# Patient Record
Sex: Female | Born: 1960
Health system: Southern US, Community
[De-identification: ages and names within clinical notes are randomized; demographics above are authoritative.]

## PROBLEM LIST (undated history)

## (undated) ENCOUNTER — Inpatient Hospital Stay (AMBULATORY_SURGERY_CENTER): Payer: BLUE CROSS/BLUE SHIELD | Admitting: Podiatry

## (undated) DIAGNOSIS — R519 Headache, unspecified: Secondary | ICD-10-CM

## (undated) DIAGNOSIS — J302 Other seasonal allergic rhinitis: Secondary | ICD-10-CM

## (undated) DIAGNOSIS — E039 Hypothyroidism, unspecified: Secondary | ICD-10-CM

## (undated) HISTORY — DX: Hypothyroidism, unspecified: E03.9

## (undated) HISTORY — DX: Headache, unspecified: R51.9

## (undated) HISTORY — PX: OTHER SURGICAL HISTORY: SHX169

## (undated) HISTORY — PX: GANGLION CYST EXCISION: SHX1691

## (undated) HISTORY — PX: APPENDECTOMY: SHX54

## (undated) HISTORY — PX: LIPOMA EXCISION: SHX5283

## (undated) HISTORY — DX: Other seasonal allergic rhinitis: J30.2

---

## 2004-06-24 ENCOUNTER — Ambulatory Visit: Payer: Self-pay | Admitting: Internal Medicine

## 2004-07-25 ENCOUNTER — Ambulatory Visit: Payer: Self-pay | Admitting: Internal Medicine

## 2005-07-03 ENCOUNTER — Ambulatory Visit: Payer: Self-pay

## 2006-05-31 ENCOUNTER — Ambulatory Visit: Payer: Self-pay | Admitting: Family Medicine

## 2007-10-09 ENCOUNTER — Ambulatory Visit: Payer: Self-pay | Admitting: Internal Medicine

## 2008-10-15 ENCOUNTER — Ambulatory Visit: Payer: Self-pay | Admitting: Internal Medicine

## 2009-05-03 ENCOUNTER — Ambulatory Visit: Payer: Self-pay | Admitting: Internal Medicine

## 2010-10-25 ENCOUNTER — Ambulatory Visit: Payer: Self-pay | Admitting: Internal Medicine

## 2011-10-26 ENCOUNTER — Ambulatory Visit: Payer: Self-pay

## 2012-11-06 ENCOUNTER — Ambulatory Visit: Payer: Self-pay

## 2013-12-04 ENCOUNTER — Ambulatory Visit: Payer: Self-pay

## 2014-03-12 ENCOUNTER — Ambulatory Visit: Payer: Self-pay | Admitting: Gastroenterology

## 2014-03-12 LAB — HM COLONOSCOPY

## 2015-06-08 DIAGNOSIS — D1722 Benign lipomatous neoplasm of skin and subcutaneous tissue of left arm: Secondary | ICD-10-CM | POA: Diagnosis not present

## 2015-06-08 DIAGNOSIS — M720 Palmar fascial fibromatosis [Dupuytren]: Secondary | ICD-10-CM | POA: Diagnosis not present

## 2015-06-08 DIAGNOSIS — M67449 Ganglion, unspecified hand: Secondary | ICD-10-CM | POA: Diagnosis not present

## 2015-06-18 DIAGNOSIS — M720 Palmar fascial fibromatosis [Dupuytren]: Secondary | ICD-10-CM | POA: Diagnosis not present

## 2015-06-18 DIAGNOSIS — D1722 Benign lipomatous neoplasm of skin and subcutaneous tissue of left arm: Secondary | ICD-10-CM | POA: Diagnosis not present

## 2015-09-23 ENCOUNTER — Encounter: Payer: Self-pay | Admitting: Podiatry

## 2015-09-23 ENCOUNTER — Ambulatory Visit (INDEPENDENT_AMBULATORY_CARE_PROVIDER_SITE_OTHER): Payer: BLUE CROSS/BLUE SHIELD | Admitting: Podiatry

## 2015-09-23 ENCOUNTER — Ambulatory Visit (INDEPENDENT_AMBULATORY_CARE_PROVIDER_SITE_OTHER): Payer: BLUE CROSS/BLUE SHIELD

## 2015-09-23 VITALS — BP 123/79 | HR 80 | Resp 16

## 2015-09-23 DIAGNOSIS — M722 Plantar fascial fibromatosis: Secondary | ICD-10-CM

## 2015-09-23 NOTE — Progress Notes (Signed)
   Subjective:    Patient ID: Megan [name], female    DOB: Dec 02, 1960, 55 y.o.   MRN: [id number]  HPI: She presents today with a chief complaint of painful nodules to the plantar aspect of the bilateral foot for the past couple of years she states the left one has most recently become more painful and larger in size. She does relate a history of Dupuytren contracture to her left hand. She denies any trauma to her feet.    Review of Systems  HENT: Positive for sinus pressure.   Musculoskeletal: Positive for arthralgias.  All other systems reviewed and are negative.      Objective:   Physical Exam: Vital signs are stable she is alert and oriented 3. Pulses are palpable. Neurologic sensorium is intact. Deep tendon reflexes are intact. Muscle strength is normal symmetrical bilateral. Orthopedic evaluation was resolved with distal ankle full range of motion without crepitus she has solitary lesions to the medial band of the plantar fascia bilaterally the right appears to be much smaller than that of the left measuring 0.8 cm in total diameter and is fairly flat on the right side versus elevated nonpulsatile mass measuring greater than 1 cm in diameter on the left medial band of the plantar fascia. Radiographs do not demonstrate any type of calcification ossification of these lesions.        Assessment & Plan:  Plantar fibromatosis bilateral.  Plan: Discussed etiology pathology conservative versus surgical therapies. I injected these areas today with Kenalog and local anesthetic and I will follow-up with her in 3 months.

## 2015-10-11 ENCOUNTER — Telehealth: Payer: Self-pay

## 2015-10-11 NOTE — Telephone Encounter (Signed)
Records on this pt placed in your folder for review. Pt has appt Friday 10/15/2015 [facility]

## 2015-10-15 ENCOUNTER — Encounter: Payer: BLUE CROSS/BLUE SHIELD | Admitting: Family Medicine

## 2015-11-03 ENCOUNTER — Encounter: Payer: BLUE CROSS/BLUE SHIELD | Admitting: Family Medicine

## 2015-11-05 ENCOUNTER — Ambulatory Visit (INDEPENDENT_AMBULATORY_CARE_PROVIDER_SITE_OTHER): Payer: BLUE CROSS/BLUE SHIELD | Admitting: Family Medicine

## 2015-11-05 ENCOUNTER — Encounter: Payer: Self-pay | Admitting: Family Medicine

## 2015-11-05 VITALS — BP 122/80 | HR 60 | Ht 68.25 in | Wt 225.0 lb

## 2015-11-05 DIAGNOSIS — Z0000 Encounter for general adult medical examination without abnormal findings: Secondary | ICD-10-CM | POA: Diagnosis not present

## 2015-11-05 DIAGNOSIS — Z23 Encounter for immunization: Secondary | ICD-10-CM | POA: Diagnosis not present

## 2015-11-05 DIAGNOSIS — E669 Obesity, unspecified: Secondary | ICD-10-CM | POA: Diagnosis not present

## 2015-11-05 DIAGNOSIS — Z1231 Encounter for screening mammogram for malignant neoplasm of breast: Secondary | ICD-10-CM | POA: Diagnosis not present

## 2015-11-05 DIAGNOSIS — Z1159 Encounter for screening for other viral diseases: Secondary | ICD-10-CM

## 2015-11-05 DIAGNOSIS — E039 Hypothyroidism, unspecified: Secondary | ICD-10-CM | POA: Diagnosis not present

## 2015-11-05 DIAGNOSIS — Z1239 Encounter for other screening for malignant neoplasm of breast: Secondary | ICD-10-CM

## 2015-11-05 LAB — POCT URINALYSIS DIPSTICK
Bilirubin, UA: NEGATIVE
Glucose, UA: NEGATIVE
KETONES UA: NEGATIVE
Leukocytes, UA: NEGATIVE
Nitrite, UA: NEGATIVE
PH UA: 7
PROTEIN UA: NEGATIVE
RBC UA: NEGATIVE
SPEC GRAV UA: 1.015
UROBILINOGEN UA: NEGATIVE

## 2015-11-05 LAB — COMPREHENSIVE METABOLIC PANEL
ALBUMIN: 4.3 g/dL (ref 3.6–5.1)
ALT: 15 U/L (ref 6–29)
AST: 19 U/L (ref 10–35)
Alkaline Phosphatase: 69 U/L (ref 33–130)
BUN: 13 mg/dL (ref 7–25)
CHLORIDE: 107 mmol/L (ref 98–110)
CO2: 23 mmol/L (ref 20–31)
CREATININE: 0.92 mg/dL (ref 0.50–1.05)
Calcium: 9.5 mg/dL (ref 8.6–10.4)
Glucose, Bld: 98 mg/dL (ref 65–99)
Potassium: 4.9 mmol/L (ref 3.5–5.3)
SODIUM: 141 mmol/L (ref 135–146)
Total Bilirubin: 0.5 mg/dL (ref 0.2–1.2)
Total Protein: 7 g/dL (ref 6.1–8.1)

## 2015-11-05 LAB — CBC WITH DIFFERENTIAL/PLATELET
BASOS ABS: 47 {cells}/uL (ref 0–200)
Basophils Relative: 1 %
EOS PCT: 2 %
Eosinophils Absolute: 94 cells/uL (ref 15–500)
HCT: 43.6 % (ref 35.0–45.0)
HEMOGLOBIN: 14.5 g/dL (ref 11.7–15.5)
LYMPHS ABS: 1833 {cells}/uL (ref 850–3900)
LYMPHS PCT: 39 %
MCH: 31 pg (ref 27.0–33.0)
MCHC: 33.3 g/dL (ref 32.0–36.0)
MCV: 93.4 fL (ref 80.0–100.0)
MONOS PCT: 11 %
MPV: 9.9 fL (ref 7.5–12.5)
Monocytes Absolute: 517 cells/uL (ref 200–950)
NEUTROS PCT: 47 %
Neutro Abs: 2209 cells/uL (ref 1500–7800)
Platelets: 269 10*3/uL (ref 140–400)
RBC: 4.67 MIL/uL (ref 3.80–5.10)
RDW: 13.2 % (ref 11.0–15.0)
WBC: 4.7 10*3/uL (ref 4.0–10.5)

## 2015-11-05 LAB — LIPID PANEL
CHOL/HDL RATIO: 2.9 ratio (ref <=5.0)
CHOLESTEROL: 171 mg/dL (ref 125–200)
HDL: 59 mg/dL (ref >=46)
LDL Cholesterol: 94 mg/dL (ref <130)
Triglycerides: 92 mg/dL (ref <150)
VLDL: 18 mg/dL (ref <30)

## 2015-11-05 LAB — TSH: TSH: 0.05 m[IU]/L — AB

## 2015-11-05 NOTE — Telephone Encounter (Signed)
This encounter was created in error - please disregard.

## 2015-11-05 NOTE — Progress Notes (Signed)
Subjective:    Patient ID: Megan [name], female    DOB: May 26, 1960, 55 y.o.   MRN: [id number]  HPI Chief Complaint  Patient presents with  . new pt    new pt, fasting cpe. no concerns.    She is new to the practice and here for a complete physical exam. Previous medical care: [facility] in [place].  [place]- had a lipoma removed from her upper arm.   Last CPE: a year ago.   Other providers: [facility]- lipoma on arm. Podiatrist - [place] for ganglion and lederhousen   Past medical history: hypothyroidism. Diagnosed 21 years ago after son was born.   Social history: Lives with husband, works at a [employer],  Diet: nothing particular Excerise: zumba, strength training   Immunizations: Tdap unknown. Flu shot   Health maintenance:  Mammogram: October 2016 at [facility] [place]  Colonoscopy: 2016- [place]. Normal. 10 year.  Last Gynecological Exam: 2 years ago. Normal pap smear Last Menstrual cycle: 3 years ago- ablation Pregnancies: 3 Last Dental Exam: twice annually  Last Eye Exam: annually   Wears seatbelt always, uses sunscreen, smoke detectors in home and functioning, does not text while driving and feels safe in home environment.   Reviewed allergies, medications, past medical, surgical, family, and social history.   Review of Systems Review of Systems Constitutional: -fever, -chills, -sweats, -unexpected weight change,-fatigue ENT: -runny nose, -ear pain, -sore throat Cardiology:  -chest pain, -palpitations, -edema Respiratory: -cough, -shortness of breath, -wheezing Gastroenterology: -abdominal pain, -nausea, -vomiting, -diarrhea, -constipation  Hematology: -bleeding or bruising problems Musculoskeletal: -arthralgias, -myalgias, -joint swelling, -back pain Ophthalmology: -vision changes Urology: -dysuria, -difficulty urinating, -hematuria, -urinary frequency, -urgency Neurology: -headache, -weakness, -tingling,  -numbness       Objective:   Physical Exam BP 122/80   Pulse 60   Ht 5' 8.25" (1.734 m)   Wt 225 lb (102.1 kg)   BMI 33.96 kg/m   General Appearance:    Alert, cooperative, no distress, appears stated age  Head:    Normocephalic, without obvious abnormality, atraumatic  Eyes:    PERRL, conjunctiva/corneas clear, EOM's intact, fundi    benign  Ears:    Normal TM's and external ear canals  Nose:   Nares normal, mucosa normal, no drainage or sinus   tenderness  Throat:   Lips, mucosa, and tongue normal; teeth and gums normal  Neck:   Supple, no lymphadenopathy;  thyroid:  no   enlargement/tenderness/nodules; no carotid   bruit or JVD  Back:    Spine nontender, no curvature, ROM normal, no CVA     tenderness  Lungs:     Clear to auscultation bilaterally without wheezes, rales or     ronchi; respirations unlabored  Chest Wall:    No tenderness or deformity   Heart:    Regular rate and rhythm, S1 and S2 normal, no murmur, rub   or gallop  Breast Exam:    No tenderness, masses, or nipple discharge or inversion.      No axillary lymphadenopathy  Abdomen:     Soft, non-tender, nondistended, normoactive bowel sounds,    no masses, no hepatosplenomegaly  Genitalia:    Declined. Up to date.   Rectal:    Normal tone, no masses or tenderness; guaiac negative stool  Extremities:   No clubbing, cyanosis or edema  Pulses:   2+ and symmetric all extremities  Skin:   Skin color, texture, turgor normal, no rashes or lesions  Lymph nodes:   Cervical, supraclavicular, and axillary nodes normal  Neurologic:   CNII-XII intact, normal strength, sensation and gait; reflexes 2+ and symmetric throughout          Psych:   Normal mood, affect, hygiene and grooming.    Urinalysis dipstick: negative     Assessment & Plan:  Routine general medical examination at a health care facility - Plan: Urinalysis Dipstick, CBC with Differential/Platelet, Comprehensive metabolic panel, TSH, Lipid panel, VITAMIN D 25  Hydroxy (Vit-D Deficiency, Fractures)  Hypothyroidism, unspecified type - Plan: TSH  Need for hepatitis C screening test - Plan: Hepatitis C antibody  Obesity (BMI 30-39.9) - Plan: TSH, Lipid panel, VITAMIN D 25 Hydroxy (Vit-D Deficiency, Fractures)  Need for prophylactic vaccination and inoculation against influenza - Plan: Flu Vaccine QUAD 36+ mos IM  Need for Tdap vaccination - Plan: Tdap vaccine greater than or equal to 7yo IM  Screening for breast cancer - Plan: MM DIGITAL SCREENING BILATERAL  Discussed that she appears to be doing well overall and I recommend that she start eating a healthy well balanced diet. Recommend to try My Fitness Pal to track daily calories and nutrition intake. She is doing a good job exercising and encouraged her to keep this up. She is aware her BMI places her in the obese category.  She has a good demeanor and overall outlook.  Discussed safety and health promotion.  One time hep C test ordered per guidelines for her age group. Mammogram ordered.  Tdap and Flu shot given.  Will check labs and follow up pending.  She has been stable on thyroid medication for several years and will continue to monitor and refill medication as needed.

## 2015-11-05 NOTE — Patient Instructions (Signed)
You received your flu shot and Tdap today.  You can try a free app called My Fitness Pal to track your daily calories and nutrition.  Continue exercising.  Call and schedule your mammogram for March or April if you are not having any problems before then. The order is in the system.  We will call you with lab results.   Preventive Care for Adults - Female      MAINTAIN REGULAR HEALTH EXAMS:  A routine yearly physical is a good way to check in with your primary care provider about your health and preventive screening. It is also an opportunity to share updates about your health and any concerns you have, and receive a thorough all-over exam.   Most health insurance companies pay for at least some preventative services.  Check with your health plan for specific coverages.  WHAT PREVENTATIVE SERVICES DO WOMEN NEED?  Adult women should have their weight and blood pressure checked regularly.   Women age 2 and older should have their cholesterol levels checked regularly.  Women should be screened for cervical cancer with a Pap smear and pelvic exam beginning at either age 8, or 3 years after they become sexually activity.    Breast cancer screening generally begins at age 19 with a mammogram and breast exam by your primary care provider.    Beginning at age 34 and continuing to age 43, women should be screened for colorectal cancer.  Certain people may need continued testing until age 27.  Updating vaccinations is part of preventative care.  Vaccinations help protect against diseases such as the flu.  Osteoporosis is a disease in which the bones lose minerals and strength as we age. Women ages 29 and over should discuss this with their caregivers, as should women after menopause who have other risk factors.  Lab tests are generally done as part of preventative care to screen for anemia and blood disorders, to screen for problems with the kidneys and liver, to screen for bladder problems, to  check blood sugar, and to check your cholesterol level.  Preventative services generally include counseling about diet, exercise, avoiding tobacco, drugs, excessive alcohol consumption, and sexually transmitted infections.    GENERAL RECOMMENDATIONS FOR GOOD HEALTH:  Healthy diet:  Eat a variety of foods, including fruit, vegetables, animal or vegetable protein, such as meat, fish, chicken, and eggs, or beans, lentils, tofu, and grains, such as rice.  Drink plenty of water daily.  Decrease saturated fat in the diet, avoid lots of red meat, processed foods, sweets, fast foods, and fried foods.  Exercise:  Aerobic exercise helps maintain good heart health. At least 30-40 minutes of moderate-intensity exercise is recommended. For example, a brisk walk that increases your heart rate and breathing. This should be done on most days of the week.   Find a type of exercise or a variety of exercises that you enjoy so that it becomes a part of your daily life.  Examples are running, walking, swimming, water aerobics, and biking.  For motivation and support, explore group exercise such as aerobic class, spin class, Zumba, Yoga,or  martial arts, etc.    Set exercise goals for yourself, such as a certain weight goal, walk or run in a race such as a 5k walk/run.  Speak to your primary care provider about exercise goals.  Disease prevention:  If you smoke or chew tobacco, find out from your caregiver how to quit. It can literally save your life, no matter how long  you have been a tobacco user. If you do not use tobacco, never begin.   Maintain a healthy diet and normal weight. Increased weight leads to problems with blood pressure and diabetes.   The Body Mass Index or BMI is a way of measuring how much of your body is fat. Having a BMI above 27 increases the risk of heart disease, diabetes, hypertension, stroke and other problems related to obesity. Your caregiver can help determine your BMI and based  on it develop an exercise and dietary program to help you achieve or maintain this important measurement at a healthful level.  High blood pressure causes heart and blood vessel problems.  Persistent high blood pressure should be treated with medicine if weight loss and exercise do not work.   Fat and cholesterol leaves deposits in your arteries that can block them. This causes heart disease and vessel disease elsewhere in your body.  If your cholesterol is found to be high, or if you have heart disease or certain other medical conditions, then you may need to have your cholesterol monitored frequently and be treated with medication.   Ask if you should have a cardiac stress test if your history suggests this. A stress test is a test done on a treadmill that looks for heart disease. This test can find disease prior to there being a problem.  Menopause can be associated with physical symptoms and risks. Hormone replacement therapy is available to decrease these. You should talk to your caregiver about whether starting or continuing to take hormones is right for you.   Osteoporosis is a disease in which the bones lose minerals and strength as we age. This can result in serious bone fractures. Risk of osteoporosis can be identified using a bone density scan. Women ages 65 and over should discuss this with their caregivers, as should women after menopause who have other risk factors. Ask your caregiver whether you should be taking a calcium supplement and Vitamin D, to reduce the rate of osteoporosis.   Avoid drinking alcohol in excess (more than two drinks per day).  Avoid use of street drugs. Do not share needles with anyone. Ask for professional help if you need assistance or instructions on stopping the use of alcohol, cigarettes, and/or drugs.  Brush your teeth twice a day with fluoride toothpaste, and floss once a day. Good oral hygiene prevents tooth decay and gum disease. The problems can be  painful, unattractive, and can cause other health problems. Visit your dentist for a routine oral and dental check up and preventive care every 6-12 months.   Look at your skin regularly.  Use a mirror to look at your back. Notify your caregivers of changes in moles, especially if there are changes in shapes, colors, a size larger than a pencil eraser, an irregular border, or development of new moles.  Safety:  Use seatbelts 100% of the time, whether driving or as a passenger.  Use safety devices such as hearing protection if you work in environments with loud noise or significant background noise.  Use safety glasses when doing any work that could send debris in to the eyes.  Use a helmet if you ride a bike or motorcycle.  Use appropriate safety gear for contact sports.  Talk to your caregiver about gun safety.  Use sunscreen with a SPF (or skin protection factor) of 15 or greater.  Lighter skinned people are at a greater risk of skin cancer. Don't forget to also wear sunglasses  in order to protect your eyes from too much damaging sunlight. Damaging sunlight can accelerate cataract formation.   Practice safe sex. Use condoms. Condoms are used for birth control and to help reduce the spread of sexually transmitted infections (or STIs).  Some of the STIs are gonorrhea (the clap), chlamydia, syphilis, trichomonas, herpes, HPV (human papilloma virus) and HIV (human immunodeficiency virus) which causes AIDS. The herpes, HIV and HPV are viral illnesses that have no cure. These can result in disability, cancer and death.   Keep carbon monoxide and smoke detectors in your home functioning at all times. Change the batteries every 6 months or use a model that plugs into the wall.   Vaccinations:  Stay up to date with your tetanus shots and other required immunizations. You should have a booster for tetanus every 10 years. Be sure to get your flu shot every year, since 5%-20% of the [place] population comes down  with the flu. The flu vaccine changes each year, so being vaccinated once is not enough. Get your shot in the fall, before the flu season peaks.   Other vaccines to consider:  Human Papilloma Virus or HPV causes cancer of the cervix, and other infections that can be transmitted from person to person. There is a vaccine for HPV, and females should get immunized between the ages of 74 and 16. It requires a series of 3 shots.   Pneumococcal vaccine to protect against certain types of pneumonia.  This is normally recommended for adults age 62 or older.  However, adults younger than 53 years old with certain underlying conditions such as diabetes, heart or lung disease should also receive the vaccine.  Shingles vaccine to protect against Varicella Zoster if you are older than age 75, or younger than 53 years old with certain underlying illness.  Hepatitis A vaccine to protect against a form of infection of the liver by a virus acquired from food.  Hepatitis B vaccine to protect against a form of infection of the liver by a virus acquired from blood or body fluids, particularly if you work in health care.  If you plan to travel internationally, check with your local health department for specific vaccination recommendations.  Cancer Screening:  Breast cancer screening is essential to preventive care for women. All women age 68 and older should perform a breast self-exam every month. At age 36 and older, women should have their caregiver complete a breast exam each year. Women at ages 80 and older should have a mammogram (x-ray film) of the breasts. Your caregiver can discuss how often you need mammograms.    Cervical cancer screening includes taking a Pap smear (sample of cells examined under a microscope) from the cervix (end of the uterus). It also includes testing for HPV (Human Papilloma Virus, which can cause cervical cancer). Screening and a pelvic exam should begin at age 61, or 3 years after a  woman becomes sexually active. Screening should occur every year, with a Pap smear but no HPV testing, up to age 7. After age 46, you should have a Pap smear every 3 years with HPV testing, if no HPV was found previously.   Most routine colon cancer screening begins at the age of 70. On a yearly basis, doctors may provide special easy to use take-home tests to check for hidden blood in the stool. Sigmoidoscopy or colonoscopy can detect the earliest forms of colon cancer and is life saving. These tests use a small camera at the  end of a tube to directly examine the colon. Speak to your caregiver about this at age 34, when routine screening begins (and is repeated every 5 years unless early forms of pre-cancerous polyps or small growths are found).

## 2015-11-06 LAB — VITAMIN D 25 HYDROXY (VIT D DEFICIENCY, FRACTURES): VIT D 25 HYDROXY: 30 ng/mL (ref 30–100)

## 2015-11-06 LAB — HEPATITIS C ANTIBODY: HCV Ab: NEGATIVE

## 2015-11-08 ENCOUNTER — Telehealth: Payer: Self-pay

## 2015-11-08 ENCOUNTER — Other Ambulatory Visit: Payer: Self-pay | Admitting: Family Medicine

## 2015-11-08 DIAGNOSIS — E039 Hypothyroidism, unspecified: Secondary | ICD-10-CM

## 2015-11-08 MED ORDER — LEVOTHYROXINE SODIUM 125 MCG PO TABS: 125.0000 ug | ORAL_TABLET | Freq: Every day | ORAL | 3 refills | Status: DC

## 2015-11-08 NOTE — Telephone Encounter (Signed)
Already spoke with patient about lab results

## 2015-11-08 NOTE — Telephone Encounter (Signed)
Pt returning your call [phone]. Megan [name]

## 2015-11-17 ENCOUNTER — Encounter: Payer: Self-pay | Admitting: Family Medicine

## 2015-12-23 ENCOUNTER — Ambulatory Visit (INDEPENDENT_AMBULATORY_CARE_PROVIDER_SITE_OTHER): Payer: BLUE CROSS/BLUE SHIELD | Admitting: Podiatry

## 2015-12-23 ENCOUNTER — Encounter: Payer: Self-pay | Admitting: Podiatry

## 2015-12-23 DIAGNOSIS — M722 Plantar fascial fibromatosis: Secondary | ICD-10-CM | POA: Diagnosis not present

## 2015-12-24 DIAGNOSIS — D2372 Other benign neoplasm of skin of left lower limb, including hip: Secondary | ICD-10-CM | POA: Diagnosis not present

## 2015-12-24 DIAGNOSIS — L821 Other seborrheic keratosis: Secondary | ICD-10-CM | POA: Diagnosis not present

## 2015-12-24 DIAGNOSIS — L814 Other melanin hyperpigmentation: Secondary | ICD-10-CM | POA: Diagnosis not present

## 2015-12-24 DIAGNOSIS — D2371 Other benign neoplasm of skin of right lower limb, including hip: Secondary | ICD-10-CM | POA: Diagnosis not present

## 2015-12-24 NOTE — Progress Notes (Signed)
She presents today for follow-up of her plantar fibromas bilaterally. She states the left foot is doing very well the right foot still has some soreness in it and she points to the plantar fibromas distal medial plantar fascial band.  Objective: Plantar fibromas have decreased in size considerably. No erythema edema cellulitis drainage or odor.  Assessment: Plantar fasciitis plantar fibromatosis right greater than left.  Plan: I injected the right plantar fibroma today with Kenalog and local anesthetic. I will follow-up with her in 3 months.

## 2015-12-30 DIAGNOSIS — M67449 Ganglion, unspecified hand: Secondary | ICD-10-CM | POA: Diagnosis not present

## 2016-01-10 ENCOUNTER — Other Ambulatory Visit: Payer: BLUE CROSS/BLUE SHIELD

## 2016-01-12 ENCOUNTER — Other Ambulatory Visit: Payer: Self-pay

## 2016-01-12 DIAGNOSIS — M67442 Ganglion, left hand: Secondary | ICD-10-CM | POA: Diagnosis not present

## 2016-01-12 DIAGNOSIS — M67441 Ganglion, right hand: Secondary | ICD-10-CM | POA: Diagnosis not present

## 2016-01-12 DIAGNOSIS — D2112 Benign neoplasm of connective and other soft tissue of left upper limb, including shoulder: Secondary | ICD-10-CM | POA: Diagnosis not present

## 2016-01-12 DIAGNOSIS — D2111 Benign neoplasm of connective and other soft tissue of right upper limb, including shoulder: Secondary | ICD-10-CM | POA: Diagnosis not present

## 2016-02-01 ENCOUNTER — Other Ambulatory Visit: Payer: BLUE CROSS/BLUE SHIELD

## 2016-02-01 DIAGNOSIS — E039 Hypothyroidism, unspecified: Secondary | ICD-10-CM

## 2016-02-01 LAB — T4, FREE: Free T4: 1.2 ng/dL (ref 0.8–1.8)

## 2016-02-01 LAB — TSH: TSH: 3.31 mIU/L

## 2016-02-02 LAB — T3: T3 TOTAL: 105 ng/dL (ref 76–181)

## 2016-02-07 ENCOUNTER — Other Ambulatory Visit: Payer: Self-pay | Admitting: Family Medicine

## 2016-02-26 DIAGNOSIS — J069 Acute upper respiratory infection, unspecified: Secondary | ICD-10-CM | POA: Diagnosis not present

## 2016-03-23 ENCOUNTER — Ambulatory Visit: Payer: BLUE CROSS/BLUE SHIELD | Admitting: Podiatry

## 2016-05-05 ENCOUNTER — Other Ambulatory Visit: Payer: Self-pay | Admitting: Family Medicine

## 2016-05-05 DIAGNOSIS — Z1231 Encounter for screening mammogram for malignant neoplasm of breast: Secondary | ICD-10-CM

## 2016-05-24 ENCOUNTER — Ambulatory Visit
Admission: RE | Admit: 2016-05-24 | Discharge: 2016-05-24 | Disposition: A | Discharge status: Home / Self Care | Payer: BLUE CROSS/BLUE SHIELD | Source: Ambulatory Visit | Attending: Family Medicine | Admitting: Family Medicine

## 2016-05-24 DIAGNOSIS — Z1231 Encounter for screening mammogram for malignant neoplasm of breast: Secondary | ICD-10-CM | POA: Diagnosis not present

## 2016-06-09 ENCOUNTER — Other Ambulatory Visit: Payer: Self-pay | Admitting: Family Medicine

## 2016-09-16 ENCOUNTER — Other Ambulatory Visit: Payer: Self-pay | Admitting: Family Medicine

## 2016-10-27 ENCOUNTER — Ambulatory Visit (INDEPENDENT_AMBULATORY_CARE_PROVIDER_SITE_OTHER): Payer: BLUE CROSS/BLUE SHIELD | Admitting: Medical

## 2016-10-27 ENCOUNTER — Encounter: Payer: Self-pay | Admitting: Medical

## 2016-10-27 VITALS — BP 132/80 | HR 62 | Temp 98.1°F | Wt 212.2 lb

## 2016-10-27 DIAGNOSIS — R300 Dysuria: Secondary | ICD-10-CM | POA: Diagnosis not present

## 2016-10-27 LAB — POCT URINALYSIS DIP (PROADVANTAGE DEVICE)
BILIRUBIN UA: NEGATIVE
Blood, UA: NEGATIVE
GLUCOSE UA: NEGATIVE mg/dL
Ketones, POC UA: NEGATIVE mg/dL
LEUKOCYTES UA: NEGATIVE
NITRITE UA: NEGATIVE
Protein Ur, POC: NEGATIVE mg/dL
Specific Gravity, Urine: 1.015
Urobilinogen, Ur: NEGATIVE
pH, UA: 6 (ref 5.0–8.0)

## 2016-10-27 MED ORDER — SULFAMETHOXAZOLE-TRIMETHOPRIM 800-160 MG PO TABS: 1.0000 | ORAL_TABLET | Freq: Two times a day (BID) | ORAL | 0 refills | Status: DC

## 2016-10-27 NOTE — Progress Notes (Signed)
Subjective:  Megan [name] is a 56 y.o. female who complains of possible urinary tract infection.  She has had symptoms for 1 week.  She reports suprapubic pressure, having some urinary frequency without pain, but has some pressure if she feels full of urine.   No blood in urine, no odor, no fever, no NVD, sometimes constipation.   No back pain.   Having some urgency.   Last UTI was years ago.    No frequent UTI.   No vaginal discharge.  Married, no concern for STD.  Few months ago started doing more core exercises at the gym.  Doing more lower body exercise that pulls in groin.  LMP few years ago.  Had Novasure procedure in the past.   No other aggravating or relieving factors.  No other c/o.  Past Medical History:  Diagnosis Date  . Hypothyroidism     ROS as in subjective  Reviewed allergies, medications, past medical, surgical, and social history.    Objective: Vitals:   10/27/16 1414  BP: 132/80  Pulse: 62  Temp: 98.1 F (36.7 C)    General appearance: alert, no distress, WD/WN, female Abdomen: +bs, soft, non tender, non distended, no masses, no hepatomegaly, no splenomegaly, no bruits Back: no CVA tenderness GU: deferred Leg lifting and abdominal crunch demonstrates no pain in pelvic or abdominal region     Assessment: Encounter Diagnosis  Name Primary?  . Dysuria Yes     Plan: Discussed symptoms, differential.  Likely UTI.  Sent Bactrim for UTI symptoms, culture sent.   Discussed hydration, can use cranberry juice a few days.  F/u pending culture  Megan [name] was seen today for possible uti.  Diagnoses and all orders for this visit:  Dysuria -     Urine Culture  Other orders -     sulfamethoxazole-trimethoprim (BACTRIM DS,SEPTRA DS) 800-160 MG tablet; Take 1 tablet by mouth 2 (two) times daily.

## 2016-10-28 LAB — URINE CULTURE
MICRO NUMBER: 81110523
SPECIMEN QUALITY: ADEQUATE

## 2016-11-08 NOTE — Progress Notes (Signed)
Subjective:    Patient ID: Megan [name], female    DOB: 25-Feb-1960, 56 y.o.   MRN: [id number]  HPI Chief Complaint  Patient presents with  . fasting cpe    fasting cpe, wants flu shot and needs tdap shot today, due for pap smear today. would like valtrex for her cold sores she gets twice a day, eye exam done couple months ago   She is here for a complete physical exam. She was treated for UTI symptoms a few weeks ago and her symptoms have resolved.    History of hypothyroidism- diagnosed 5 or more years ago. Denies having nodule or seeing an endocrinologist for this in the past. Has been stable on medication. No issues with medication.   Other providers:  [place] doctor-  Dermatologist-   Depression screen [facility] 2/9 11/09/2016 11/05/2015  Decreased Interest 0 0  Down, Depressed, Hopeless 0 0  PHQ - 2 Score 0 0   History of cold sores- 1-2 per year. Usually take Valtrex for this but is out. Would like medication sent to pharmacy.   Social history: Lives with husband, works as English as a second language teacher  Smoked 1 ppd for 10-15 years and stopped in 1999.  Diet: unhealthy  Excerise: 4 days per week. Strength and cardio   Immunizations: would like Tdap and flu shot.   Health maintenance:  Mammogram: May 2018  Colonoscopy: 3 years ago no polyps. [place]  Last Gynecological Exam: 2015 Last Menstrual cycle: No. Ablation.  DEXA: does not thinks she has had one. Would like to get this next year with her next mammogram.  Last Dental Exam: twice annually  Last Eye Exam: 2 months ago.   Wears seatbelt always, uses sunscreen, smoke detectors in home and functioning, does not text while driving and feels safe in home environment.   Reviewed allergies, medications, past medical, surgical, family, and social history.   Review of Systems Review of Systems Constitutional: -fever, -chills, -sweats, -unexpected weight change,-fatigue ENT: -runny nose, -ear pain, -sore  throat Cardiology:  -chest pain, -palpitations, -edema Respiratory: -cough, -shortness of breath, -wheezing Gastroenterology: -abdominal pain, -nausea, -vomiting, -diarrhea, -constipation  Hematology: -bleeding or bruising problems Musculoskeletal: -arthralgias, -myalgias, -joint swelling, -back pain Ophthalmology: -vision changes Urology: -dysuria, -difficulty urinating, -hematuria, -urinary frequency, -urgency Neurology: -headache, -weakness, -tingling, -numbness       Objective:   Physical Exam BP 120/80   Pulse 67   Ht 5' 8.5" (1.74 m)   Wt 213 lb 3.2 oz (96.7 kg)   BMI 31.95 kg/m   General Appearance:    Alert, cooperative, no distress, appears stated age  Head:    Normocephalic, without obvious abnormality, atraumatic  Eyes:    PERRL, conjunctiva/corneas clear, EOM's intact, fundi    benign  Ears:    Normal TM's and external ear canals  Nose:   Nares normal, mucosa normal, no drainage or sinus   tenderness  Throat:   Lips, mucosa, and tongue normal; teeth and gums normal  Neck:   Supple, no lymphadenopathy;  thyroid:  no   enlargement/tenderness/nodules; no carotid   bruit or JVD  Back:    Spine nontender, no curvature, ROM normal, no CVA     Tenderness. Soft, round, non tender, mobile mass on her right upper back consistent with lipoma.   Lungs:     Clear to auscultation bilaterally without wheezes, rales or     ronchi; respirations unlabored  Chest Wall:    No tenderness or deformity  Heart:    Regular rate and rhythm, S1 and S2 normal, no murmur, rub   or gallop  Breast Exam:    No tenderness, masses, or nipple discharge or inversion.      No axillary lymphadenopathy  Abdomen:     Soft, non-tender, nondistended, normoactive bowel sounds,    no masses, no hepatosplenomegaly  Genitalia:    Normal external genitalia without lesions.  BUS and vagina normal; cervix without lesions, or cervical motion tenderness. No abnormal vaginal discharge.  Uterus and adnexa not  enlarged, nontender, no masses.  Pap performed. Chaperone present.      Extremities:   No clubbing, cyanosis or edema  Pulses:   2+ and symmetric all extremities  Skin:   Skin color, texture, turgor normal, no rashes or lesions  Lymph nodes:   Cervical, supraclavicular, and axillary nodes normal  Neurologic:   CNII-XII intact, normal strength, sensation and gait; reflexes 2+ and symmetric throughout          Psych:   Normal mood, affect, hygiene and grooming.    Urinalysis dipstick: negative.      Assessment & Plan:  Routine general medical examination at a health care facility - Plan: POCT Urinalysis DIP (Proadvantage Device), CBC with Differential/Platelet, Comprehensive metabolic panel, TSH, Lipid panel  Screening for cervical cancer - Plan: Cytology - PAP  Screen for STD (sexually transmitted disease) - Plan: HIV antibody  Seasonal allergies  Hypothyroidism, unspecified type - Plan: TSH  Herpes labialis - Plan: valACYclovir (VALTREX) 1000 MG tablet  Need for Tdap vaccination - Plan: Tdap vaccine greater than or equal to 7yo IM  Needs flu shot - Plan: Flu Vaccine QUAD 36+ mos IM  Vaccine counseling  She appears to be doing well physically and emotionally.  Seasonal allergies are not bothersome.  Will check TSH and adjust medication as needed.  Discussed Valtrex for cold sores and how to take this.  Flu shot given. Tdap given. She has a new grandchild on the way.  Counseled on all components of Tdap.  HIV test ordered to update health maintenance.  Pap smear done and chaperone present.  Discussed healthy lifestyle and cutting back on carbs to lose weight. She will focus on this for now.  Follow up pending labs. Plan to send her for DEXA next year with her mammogram. Will attempt to get colonoscopy records but she reports being up to date.

## 2016-11-09 ENCOUNTER — Ambulatory Visit (INDEPENDENT_AMBULATORY_CARE_PROVIDER_SITE_OTHER): Payer: BLUE CROSS/BLUE SHIELD | Admitting: Family Medicine

## 2016-11-09 ENCOUNTER — Other Ambulatory Visit (HOSPITAL_COMMUNITY)
Admission: RE | Admit: 2016-11-09 | Discharge: 2016-11-09 | Disposition: A | Discharge status: Home / Self Care | Payer: BLUE CROSS/BLUE SHIELD | Source: Ambulatory Visit | Attending: Family Medicine | Admitting: Family Medicine

## 2016-11-09 ENCOUNTER — Encounter: Payer: Self-pay | Admitting: Family Medicine

## 2016-11-09 VITALS — BP 120/80 | HR 67 | Ht 68.5 in | Wt 213.2 lb

## 2016-11-09 DIAGNOSIS — Z124 Encounter for screening for malignant neoplasm of cervix: Secondary | ICD-10-CM | POA: Diagnosis not present

## 2016-11-09 DIAGNOSIS — B001 Herpesviral vesicular dermatitis: Secondary | ICD-10-CM | POA: Diagnosis not present

## 2016-11-09 DIAGNOSIS — E2839 Other primary ovarian failure: Secondary | ICD-10-CM | POA: Insufficient documentation

## 2016-11-09 DIAGNOSIS — Z113 Encounter for screening for infections with a predominantly sexual mode of transmission: Secondary | ICD-10-CM

## 2016-11-09 DIAGNOSIS — J302 Other seasonal allergic rhinitis: Secondary | ICD-10-CM

## 2016-11-09 DIAGNOSIS — Z0000 Encounter for general adult medical examination without abnormal findings: Secondary | ICD-10-CM

## 2016-11-09 DIAGNOSIS — Z23 Encounter for immunization: Secondary | ICD-10-CM | POA: Diagnosis not present

## 2016-11-09 DIAGNOSIS — Z7185 Encounter for immunization safety counseling: Secondary | ICD-10-CM

## 2016-11-09 DIAGNOSIS — E039 Hypothyroidism, unspecified: Secondary | ICD-10-CM | POA: Diagnosis not present

## 2016-11-09 DIAGNOSIS — Z7189 Other specified counseling: Secondary | ICD-10-CM | POA: Diagnosis not present

## 2016-11-09 LAB — POCT URINALYSIS DIP (PROADVANTAGE DEVICE)
BILIRUBIN UA: NEGATIVE
BILIRUBIN UA: NEGATIVE mg/dL
Blood, UA: NEGATIVE
Glucose, UA: NEGATIVE mg/dL
LEUKOCYTES UA: NEGATIVE
Nitrite, UA: NEGATIVE
Protein Ur, POC: NEGATIVE mg/dL
SPECIFIC GRAVITY, URINE: 1.02
Urobilinogen, Ur: NEGATIVE
pH, UA: 6 (ref 5.0–8.0)

## 2016-11-09 MED ORDER — VALACYCLOVIR HCL 1 G PO TABS: ORAL_TABLET | ORAL | 1 refills | Status: DC

## 2016-11-09 NOTE — Patient Instructions (Signed)
Cut back on carbohydrates. 45-60 mg per meal and 15 mg for snacks.  Make sure you are getting at least 150 minutes per week of brisk cardiovascular activity.   We will call you with your lab results.    Preventative Care for Adults - Female      MAINTAIN REGULAR HEALTH EXAMS:  A routine yearly physical is a good way to check in with your primary care provider about your health and preventive screening. It is also an opportunity to share updates about your health and any concerns you have, and receive a thorough all-over exam.   Most health insurance companies pay for at least some preventative services.  Check with your health plan for specific coverages.  WHAT PREVENTATIVE SERVICES DO WOMEN NEED?  Adult women should have their weight and blood pressure checked regularly.   Women age 64 and older should have their cholesterol levels checked regularly.  Women should be screened for cervical cancer with a Pap smear and pelvic exam beginning at either age 95, or 3 years after they become sexually activity.    Breast cancer screening generally begins at age 76 with a mammogram and breast exam by your primary care provider.    Beginning at age 38 and continuing to age 7, women should be screened for colorectal cancer.  Certain people may need continued testing until age 52.  Updating vaccinations is part of preventative care.  Vaccinations help protect against diseases such as the flu.  Osteoporosis is a disease in which the bones lose minerals and strength as we age. Women ages 43 and over should discuss this with their caregivers, as should women after menopause who have other risk factors.  Lab tests are generally done as part of preventative care to screen for anemia and blood disorders, to screen for problems with the kidneys and liver, to screen for bladder problems, to check blood sugar, and to check your cholesterol level.  Preventative services generally include counseling about  diet, exercise, avoiding tobacco, drugs, excessive alcohol consumption, and sexually transmitted infections.    GENERAL RECOMMENDATIONS FOR GOOD HEALTH:  Healthy diet:  Eat a variety of foods, including fruit, vegetables, animal or vegetable protein, such as meat, fish, chicken, and eggs, or beans, lentils, tofu, and grains, such as rice.  Drink plenty of water daily.  Decrease saturated fat in the diet, avoid lots of red meat, processed foods, sweets, fast foods, and fried foods.  Exercise:  Aerobic exercise helps maintain good heart health. At least 30-40 minutes of moderate-intensity exercise is recommended. For example, a brisk walk that increases your heart rate and breathing. This should be done on most days of the week.   Find a type of exercise or a variety of exercises that you enjoy so that it becomes a part of your daily life.  Examples are running, walking, swimming, water aerobics, and biking.  For motivation and support, explore group exercise such as aerobic class, spin class, Zumba, Yoga,or  martial arts, etc.    Set exercise goals for yourself, such as a certain weight goal, walk or run in a race such as a 5k walk/run.  Speak to your primary care provider about exercise goals.  Disease prevention:  If you smoke or chew tobacco, find out from your caregiver how to quit. It can literally save your life, no matter how long you have been a tobacco user. If you do not use tobacco, never begin.   Maintain a healthy diet and normal  weight. Increased weight leads to problems with blood pressure and diabetes.   The Body Mass Index or BMI is a way of measuring how much of your body is fat. Having a BMI above 27 increases the risk of heart disease, diabetes, hypertension, stroke and other problems related to obesity. Your caregiver can help determine your BMI and based on it develop an exercise and dietary program to help you achieve or maintain this important measurement at a  healthful level.  High blood pressure causes heart and blood vessel problems.  Persistent high blood pressure should be treated with medicine if weight loss and exercise do not work.   Fat and cholesterol leaves deposits in your arteries that can block them. This causes heart disease and vessel disease elsewhere in your body.  If your cholesterol is found to be high, or if you have heart disease or certain other medical conditions, then you may need to have your cholesterol monitored frequently and be treated with medication.   Ask if you should have a cardiac stress test if your history suggests this. A stress test is a test done on a treadmill that looks for heart disease. This test can find disease prior to there being a problem.  Menopause can be associated with physical symptoms and risks. Hormone replacement therapy is available to decrease these. You should talk to your caregiver about whether starting or continuing to take hormones is right for you.   Osteoporosis is a disease in which the bones lose minerals and strength as we age. This can result in serious bone fractures. Risk of osteoporosis can be identified using a bone density scan. Women ages 62 and over should discuss this with their caregivers, as should women after menopause who have other risk factors. Ask your caregiver whether you should be taking a calcium supplement and Vitamin D, to reduce the rate of osteoporosis.   Avoid drinking alcohol in excess (more than two drinks per day).  Avoid use of street drugs. Do not share needles with anyone. Ask for professional help if you need assistance or instructions on stopping the use of alcohol, cigarettes, and/or drugs.  Brush your teeth twice a day with fluoride toothpaste, and floss once a day. Good oral hygiene prevents tooth decay and gum disease. The problems can be painful, unattractive, and can cause other health problems. Visit your dentist for a routine oral and dental check  up and preventive care every 6-12 months.   Look at your skin regularly.  Use a mirror to look at your back. Notify your caregivers of changes in moles, especially if there are changes in shapes, colors, a size larger than a pencil eraser, an irregular border, or development of new moles.  Safety:  Use seatbelts 100% of the time, whether driving or as a passenger.  Use safety devices such as hearing protection if you work in environments with loud noise or significant background noise.  Use safety glasses when doing any work that could send debris in to the eyes.  Use a helmet if you ride a bike or motorcycle.  Use appropriate safety gear for contact sports.  Talk to your caregiver about gun safety.  Use sunscreen with a SPF (or skin protection factor) of 15 or greater.  Lighter skinned people are at a greater risk of skin cancer. Don't forget to also wear sunglasses in order to protect your eyes from too much damaging sunlight. Damaging sunlight can accelerate cataract formation.   Practice safe sex.  Use condoms. Condoms are used for birth control and to help reduce the spread of sexually transmitted infections (or STIs).  Some of the STIs are gonorrhea (the clap), chlamydia, syphilis, trichomonas, herpes, HPV (human papilloma virus) and HIV (human immunodeficiency virus) which causes AIDS. The herpes, HIV and HPV are viral illnesses that have no cure. These can result in disability, cancer and death.   Keep carbon monoxide and smoke detectors in your home functioning at all times. Change the batteries every 6 months or use a model that plugs into the wall.   Vaccinations:  Stay up to date with your tetanus shots and other required immunizations. You should have a booster for tetanus every 10 years. Be sure to get your flu shot every year, since 5%-20% of the [place] population comes down with the flu. The flu vaccine changes each year, so being vaccinated once is not enough. Get your shot in the fall,  before the flu season peaks.   Other vaccines to consider:  Human Papilloma Virus or HPV causes cancer of the cervix, and other infections that can be transmitted from person to person. There is a vaccine for HPV, and females should get immunized between the ages of 59 and 30. It requires a series of 3 shots.   Pneumococcal vaccine to protect against certain types of pneumonia.  This is normally recommended for adults age 90 or older.  However, adults younger than 54 years old with certain underlying conditions such as diabetes, heart or lung disease should also receive the vaccine.  Shingles vaccine to protect against Varicella Zoster if you are older than age 36, or younger than 54 years old with certain underlying illness.  Hepatitis A vaccine to protect against a form of infection of the liver by a virus acquired from food.  Hepatitis B vaccine to protect against a form of infection of the liver by a virus acquired from blood or body fluids, particularly if you work in health care.  If you plan to travel internationally, check with your local health department for specific vaccination recommendations.  Cancer Screening:  Breast cancer screening is essential to preventive care for women. All women age 61 and older should perform a breast self-exam every month. At age 100 and older, women should have their caregiver complete a breast exam each year. Women at ages 42 and older should have a mammogram (x-ray film) of the breasts. Your caregiver can discuss how often you need mammograms.    Cervical cancer screening includes taking a Pap smear (sample of cells examined under a microscope) from the cervix (end of the uterus). It also includes testing for HPV (Human Papilloma Virus, which can cause cervical cancer). Screening and a pelvic exam should begin at age 29, or 3 years after a woman becomes sexually active. Screening should occur every year, with a Pap smear but no HPV testing, up to age 57.  After age 83, you should have a Pap smear every 3 years with HPV testing, if no HPV was found previously.   Most routine colon cancer screening begins at the age of 4. On a yearly basis, doctors may provide special easy to use take-home tests to check for hidden blood in the stool. Sigmoidoscopy or colonoscopy can detect the earliest forms of colon cancer and is life saving. These tests use a small camera at the end of a tube to directly examine the colon. Speak to your caregiver about this at age 46, when routine screening begins (  and is repeated every 5 years unless early forms of pre-cancerous polyps or small growths are found).

## 2016-11-10 ENCOUNTER — Encounter: Payer: BLUE CROSS/BLUE SHIELD | Admitting: Family Medicine

## 2016-11-10 LAB — COMPREHENSIVE METABOLIC PANEL
AG RATIO: 1.7 (calc) (ref 1.0–2.5)
ALKALINE PHOSPHATASE (APISO): 66 U/L (ref 33–130)
ALT: 17 U/L (ref 6–29)
AST: 21 U/L (ref 10–35)
Albumin: 4.5 g/dL (ref 3.6–5.1)
BILIRUBIN TOTAL: 0.4 mg/dL (ref 0.2–1.2)
BUN: 18 mg/dL (ref 7–25)
CALCIUM: 9.6 mg/dL (ref 8.6–10.4)
CO2: 26 mmol/L (ref 20–32)
Chloride: 104 mmol/L (ref 98–110)
Creat: 0.88 mg/dL (ref 0.50–1.05)
Globulin: 2.6 g/dL (calc) (ref 1.9–3.7)
Glucose, Bld: 95 mg/dL (ref 65–99)
Potassium: 4.7 mmol/L (ref 3.5–5.3)
SODIUM: 139 mmol/L (ref 135–146)
TOTAL PROTEIN: 7.1 g/dL (ref 6.1–8.1)

## 2016-11-10 LAB — CBC WITH DIFFERENTIAL/PLATELET
BASOS ABS: 40 {cells}/uL (ref 0–200)
Basophils Relative: 0.8 %
EOS ABS: 100 {cells}/uL (ref 15–500)
Eosinophils Relative: 2 %
HEMATOCRIT: 43.6 % (ref 35.0–45.0)
HEMOGLOBIN: 15.1 g/dL (ref 11.7–15.5)
LYMPHS ABS: 1970 {cells}/uL (ref 850–3900)
MCH: 31.5 pg (ref 27.0–33.0)
MCHC: 34.6 g/dL (ref 32.0–36.0)
MCV: 91 fL (ref 80.0–100.0)
MPV: 10.1 fL (ref 7.5–12.5)
Monocytes Relative: 7.9 %
NEUTROS ABS: 2495 {cells}/uL (ref 1500–7800)
NEUTROS PCT: 49.9 %
Platelets: 285 10*3/uL (ref 140–400)
RBC: 4.79 10*6/uL (ref 3.80–5.10)
RDW: 12.1 % (ref 11.0–15.0)
Total Lymphocyte: 39.4 %
WBC: 5 10*3/uL (ref 3.8–10.8)
WBCMIX: 395 {cells}/uL (ref 200–950)

## 2016-11-10 LAB — CYTOLOGY - PAP
DIAGNOSIS: NEGATIVE
HPV: NOT DETECTED

## 2016-11-10 LAB — LIPID PANEL
Cholesterol: 208 mg/dL — ABNORMAL HIGH (ref <200)
HDL: 69 mg/dL (ref >50)
LDL Cholesterol (Calc): 123 mg/dL (calc) — ABNORMAL HIGH
Non-HDL Cholesterol (Calc): 139 mg/dL (calc) — ABNORMAL HIGH (ref <130)
Total CHOL/HDL Ratio: 3 (calc) (ref <5.0)
Triglycerides: 68 mg/dL (ref <150)

## 2016-11-10 LAB — HIV ANTIBODY (ROUTINE TESTING W REFLEX): HIV 1&2 Ab, 4th Generation: NONREACTIVE

## 2016-11-10 LAB — TSH: TSH: 1.14 m[IU]/L

## 2016-11-13 ENCOUNTER — Other Ambulatory Visit: Payer: Self-pay | Admitting: Family Medicine

## 2016-11-13 MED ORDER — LEVOTHYROXINE SODIUM 125 MCG PO TABS: ORAL_TABLET | ORAL | 5 refills | Status: DC

## 2016-11-14 ENCOUNTER — Telehealth: Payer: Self-pay | Admitting: Family Medicine

## 2016-11-14 NOTE — Telephone Encounter (Signed)
Received Colonoscopy from [facility]. Sending back for review.

## 2016-11-15 ENCOUNTER — Encounter: Payer: Self-pay | Admitting: Family Medicine

## 2016-12-06 ENCOUNTER — Encounter: Payer: Self-pay | Admitting: Family Medicine

## 2016-12-06 ENCOUNTER — Telehealth: Payer: Self-pay | Admitting: Family Medicine

## 2016-12-06 NOTE — Telephone Encounter (Signed)
Received requested eye exam from [facility]. Sending back for review.

## 2017-02-06 DIAGNOSIS — M25561 Pain in right knee: Secondary | ICD-10-CM | POA: Diagnosis not present

## 2017-02-09 DIAGNOSIS — M25561 Pain in right knee: Secondary | ICD-10-CM | POA: Diagnosis not present

## 2017-02-12 DIAGNOSIS — M25561 Pain in right knee: Secondary | ICD-10-CM | POA: Diagnosis not present

## 2017-02-21 DIAGNOSIS — M1711 Unilateral primary osteoarthritis, right knee: Secondary | ICD-10-CM | POA: Diagnosis not present

## 2017-02-21 DIAGNOSIS — S83241A Other tear of medial meniscus, current injury, right knee, initial encounter: Secondary | ICD-10-CM | POA: Diagnosis not present

## 2017-02-21 DIAGNOSIS — M23261 Derangement of other lateral meniscus due to old tear or injury, right knee: Secondary | ICD-10-CM | POA: Diagnosis not present

## 2017-02-21 DIAGNOSIS — S83281A Other tear of lateral meniscus, current injury, right knee, initial encounter: Secondary | ICD-10-CM | POA: Diagnosis not present

## 2017-02-21 DIAGNOSIS — M23221 Derangement of posterior horn of medial meniscus due to old tear or injury, right knee: Secondary | ICD-10-CM | POA: Diagnosis not present

## 2017-02-21 DIAGNOSIS — G8918 Other acute postprocedural pain: Secondary | ICD-10-CM | POA: Diagnosis not present

## 2017-02-21 DIAGNOSIS — M6751 Plica syndrome, right knee: Secondary | ICD-10-CM | POA: Diagnosis not present

## 2017-02-26 DIAGNOSIS — M6281 Muscle weakness (generalized): Secondary | ICD-10-CM | POA: Diagnosis not present

## 2017-02-26 DIAGNOSIS — M25661 Stiffness of right knee, not elsewhere classified: Secondary | ICD-10-CM | POA: Diagnosis not present

## 2017-02-26 DIAGNOSIS — S83221D Peripheral tear of medial meniscus, current injury, right knee, subsequent encounter: Secondary | ICD-10-CM | POA: Diagnosis not present

## 2017-02-26 DIAGNOSIS — M25561 Pain in right knee: Secondary | ICD-10-CM | POA: Diagnosis not present

## 2017-03-01 DIAGNOSIS — S83221D Peripheral tear of medial meniscus, current injury, right knee, subsequent encounter: Secondary | ICD-10-CM | POA: Diagnosis not present

## 2017-03-02 DIAGNOSIS — M25561 Pain in right knee: Secondary | ICD-10-CM | POA: Diagnosis not present

## 2017-03-02 DIAGNOSIS — M6281 Muscle weakness (generalized): Secondary | ICD-10-CM | POA: Diagnosis not present

## 2017-03-02 DIAGNOSIS — M25661 Stiffness of right knee, not elsewhere classified: Secondary | ICD-10-CM | POA: Diagnosis not present

## 2017-03-02 DIAGNOSIS — S83221D Peripheral tear of medial meniscus, current injury, right knee, subsequent encounter: Secondary | ICD-10-CM | POA: Diagnosis not present

## 2017-03-07 DIAGNOSIS — M6281 Muscle weakness (generalized): Secondary | ICD-10-CM | POA: Diagnosis not present

## 2017-03-07 DIAGNOSIS — M25661 Stiffness of right knee, not elsewhere classified: Secondary | ICD-10-CM | POA: Diagnosis not present

## 2017-03-07 DIAGNOSIS — S83221D Peripheral tear of medial meniscus, current injury, right knee, subsequent encounter: Secondary | ICD-10-CM | POA: Diagnosis not present

## 2017-03-07 DIAGNOSIS — M25561 Pain in right knee: Secondary | ICD-10-CM | POA: Diagnosis not present

## 2017-03-09 DIAGNOSIS — M25561 Pain in right knee: Secondary | ICD-10-CM | POA: Diagnosis not present

## 2017-03-09 DIAGNOSIS — M25661 Stiffness of right knee, not elsewhere classified: Secondary | ICD-10-CM | POA: Diagnosis not present

## 2017-03-09 DIAGNOSIS — S83221D Peripheral tear of medial meniscus, current injury, right knee, subsequent encounter: Secondary | ICD-10-CM | POA: Diagnosis not present

## 2017-03-09 DIAGNOSIS — M6281 Muscle weakness (generalized): Secondary | ICD-10-CM | POA: Diagnosis not present

## 2017-03-15 DIAGNOSIS — M6281 Muscle weakness (generalized): Secondary | ICD-10-CM | POA: Diagnosis not present

## 2017-03-15 DIAGNOSIS — S83221D Peripheral tear of medial meniscus, current injury, right knee, subsequent encounter: Secondary | ICD-10-CM | POA: Diagnosis not present

## 2017-03-15 DIAGNOSIS — M25561 Pain in right knee: Secondary | ICD-10-CM | POA: Diagnosis not present

## 2017-03-15 DIAGNOSIS — M25661 Stiffness of right knee, not elsewhere classified: Secondary | ICD-10-CM | POA: Diagnosis not present

## 2017-03-22 DIAGNOSIS — S83221D Peripheral tear of medial meniscus, current injury, right knee, subsequent encounter: Secondary | ICD-10-CM | POA: Diagnosis not present

## 2017-03-22 DIAGNOSIS — M25661 Stiffness of right knee, not elsewhere classified: Secondary | ICD-10-CM | POA: Diagnosis not present

## 2017-03-22 DIAGNOSIS — M6281 Muscle weakness (generalized): Secondary | ICD-10-CM | POA: Diagnosis not present

## 2017-03-22 DIAGNOSIS — M25561 Pain in right knee: Secondary | ICD-10-CM | POA: Diagnosis not present

## 2017-03-28 DIAGNOSIS — M25661 Stiffness of right knee, not elsewhere classified: Secondary | ICD-10-CM | POA: Diagnosis not present

## 2017-03-28 DIAGNOSIS — M25561 Pain in right knee: Secondary | ICD-10-CM | POA: Diagnosis not present

## 2017-03-28 DIAGNOSIS — S83221D Peripheral tear of medial meniscus, current injury, right knee, subsequent encounter: Secondary | ICD-10-CM | POA: Diagnosis not present

## 2017-03-28 DIAGNOSIS — M6281 Muscle weakness (generalized): Secondary | ICD-10-CM | POA: Diagnosis not present

## 2017-04-03 ENCOUNTER — Telehealth: Payer: Self-pay | Admitting: Family Medicine

## 2017-04-03 NOTE — Telephone Encounter (Signed)
Pt requesting refill on Mometasone Furoate nasal spray. She said she had an old script for this med from previous doctor that she has been using as needed and need a refill now. Can Vickie refill now?

## 2017-04-04 DIAGNOSIS — S83221D Peripheral tear of medial meniscus, current injury, right knee, subsequent encounter: Secondary | ICD-10-CM | POA: Diagnosis not present

## 2017-04-04 DIAGNOSIS — M6281 Muscle weakness (generalized): Secondary | ICD-10-CM | POA: Diagnosis not present

## 2017-04-04 DIAGNOSIS — M25661 Stiffness of right knee, not elsewhere classified: Secondary | ICD-10-CM | POA: Diagnosis not present

## 2017-04-04 DIAGNOSIS — M25561 Pain in right knee: Secondary | ICD-10-CM | POA: Diagnosis not present

## 2017-04-04 MED ORDER — MOMETASONE FUROATE 50 MCG/ACT NA SUSP: 2.0000 | Freq: Every day | NASAL | 1 refills | Status: DC

## 2017-04-04 NOTE — Telephone Encounter (Signed)
Sent med into pharmacy 

## 2017-04-04 NOTE — Telephone Encounter (Signed)
Please advise how pt need to take this med as this is not in her med this and no dose

## 2017-04-04 NOTE — Telephone Encounter (Signed)
This is fine. She has seasonal allergies.

## 2017-04-04 NOTE — Telephone Encounter (Signed)
Mometasone nasal. 2 sprays in each nostril daily

## 2017-04-05 DIAGNOSIS — M25561 Pain in right knee: Secondary | ICD-10-CM | POA: Diagnosis not present

## 2017-05-01 ENCOUNTER — Ambulatory Visit: Payer: BLUE CROSS/BLUE SHIELD | Admitting: Medical

## 2017-05-01 DIAGNOSIS — J069 Acute upper respiratory infection, unspecified: Secondary | ICD-10-CM | POA: Diagnosis not present

## 2017-05-19 ENCOUNTER — Other Ambulatory Visit: Payer: Self-pay | Admitting: Family Medicine

## 2017-07-13 ENCOUNTER — Ambulatory Visit: Payer: BLUE CROSS/BLUE SHIELD | Admitting: Podiatry

## 2017-07-13 ENCOUNTER — Encounter: Payer: Self-pay | Admitting: Podiatry

## 2017-07-13 ENCOUNTER — Other Ambulatory Visit: Payer: Self-pay

## 2017-07-13 ENCOUNTER — Ambulatory Visit (INDEPENDENT_AMBULATORY_CARE_PROVIDER_SITE_OTHER): Payer: BLUE CROSS/BLUE SHIELD

## 2017-07-13 DIAGNOSIS — M722 Plantar fascial fibromatosis: Secondary | ICD-10-CM | POA: Diagnosis not present

## 2017-07-13 NOTE — Progress Notes (Signed)
  Subjective:  Patient ID: Megan [name], female    DOB: 1961/01/03,  MRN: [id number]  Chief Complaint  Patient presents with  . Plantar Fasciitis    bilateral plantar fibromas - got better after injections in 2017 - have started coming back   57 y.o. female returns for the above complaint.  States she has been treated for plantar fibromas in the past had received injections which helped to shrink it however they have returned.  Reports pain when walking to the lesions.  Objective:  There were no vitals filed for this visit. General AA&O x3. Normal mood and affect.  Vascular Pedal pulses palpable.  Neurologic Epicritic sensation grossly intact.  Dermatologic No open lesions. Skin normal texture and turgor.  Orthopedic:  From painful nodules along the medial band of the plantar fascia bilaterally   Assessment & Plan:  Patient was evaluated and treated and all questions answered.  Plantar fibromas bilateral -Injections delivered as below -Should pain persist would consider possible surgical excision  Procedure: Injection Tendon/Ligament Consent: Verbal consent obtained. Location: Bilateral plantar fascia at the glabrous junction; medial approach. Skin Prep: Alcohol. Injectate: 0.5 cc 0.5% marcaine plain, 0.5 cc dexamethasone phosphate,  Disposition: Patient tolerated procedure well. Injection site dressed with a band-aid.     Return in about 6 weeks (around 08/24/2017) for Plantar fasciitis.

## 2017-07-23 ENCOUNTER — Ambulatory Visit (INDEPENDENT_AMBULATORY_CARE_PROVIDER_SITE_OTHER): Payer: BLUE CROSS/BLUE SHIELD | Admitting: Orthotics

## 2017-07-23 DIAGNOSIS — M722 Plantar fascial fibromatosis: Secondary | ICD-10-CM

## 2017-07-23 NOTE — Progress Notes (Signed)
Patient was seen today for offloading painful plantar fibromas/keratomas.  Area of concerned was marked and patient was scanned/cast to offload the keratoma/fibroma.  A LW accomodative device will be fabricated for the patient with appropriate offloads.  

## 2017-08-14 ENCOUNTER — Ambulatory Visit: Payer: BLUE CROSS/BLUE SHIELD | Admitting: Orthotics

## 2017-08-14 DIAGNOSIS — M722 Plantar fascial fibromatosis: Secondary | ICD-10-CM

## 2017-08-14 NOTE — Progress Notes (Signed)
Patient came in today to pick up custom made foot orthotics.  The goals were accomplished and the patient reported no dissatisfaction with said orthotics.  Patient was advised of breakin period and how to report any issues. 

## 2017-08-24 ENCOUNTER — Ambulatory Visit: Payer: BLUE CROSS/BLUE SHIELD | Admitting: Podiatry

## 2017-08-31 ENCOUNTER — Ambulatory Visit (INDEPENDENT_AMBULATORY_CARE_PROVIDER_SITE_OTHER): Payer: BLUE CROSS/BLUE SHIELD | Admitting: Podiatry

## 2017-08-31 DIAGNOSIS — M722 Plantar fascial fibromatosis: Secondary | ICD-10-CM | POA: Diagnosis not present

## 2017-08-31 NOTE — Progress Notes (Signed)
  Subjective:  Patient ID: Megan [name], female    DOB: 02/17/1960,  MRN: [id number]  Chief Complaint  Patient presents with  . Plantar Fasciitis    bilateral fibromas - orthotics helping, still painful - requesting injections   57 y.o. female returns for the above complaint. States the injections helped, thinks the lesions are smaller. Orthotics helping but the left is somewhat bothered by the orthotics.  Objective:  There were no vitals filed for this visit. General AA&O x3. Normal mood and affect.  Vascular Pedal pulses palpable.  Neurologic Epicritic sensation grossly intact.  Dermatologic No open lesions. Skin normal texture and turgor.  Orthopedic:  From painful nodules along the medial band of the plantar fascia bilaterally   Assessment & Plan:  Patient was evaluated and treated and all questions answered.  Plantar fibromas bilateral -Injections delivered as below -Should pain persist would consider possible surgical excision  Procedure: Tendon Injection Location: Bilateral medial arch fibromas. Skin Prep: Alcohol. Injectate: 0.5 cc 1% lidocaine plain, 0.5 cc dexamethasone phosphate. Disposition: Patient tolerated procedure well. Injection site dressed with a band-aid.  Return in about 6 weeks (around 10/12/2017) for Plantar fibroma, Bilateral.

## 2017-10-12 ENCOUNTER — Ambulatory Visit (INDEPENDENT_AMBULATORY_CARE_PROVIDER_SITE_OTHER): Payer: BLUE CROSS/BLUE SHIELD | Admitting: Podiatry

## 2017-10-12 DIAGNOSIS — D2122 Benign neoplasm of connective and other soft tissue of left lower limb, including hip: Secondary | ICD-10-CM | POA: Diagnosis not present

## 2017-10-12 DIAGNOSIS — Z6830 Body mass index (BMI) 30.0-30.9, adult: Secondary | ICD-10-CM

## 2017-10-12 DIAGNOSIS — M722 Plantar fascial fibromatosis: Secondary | ICD-10-CM

## 2017-10-12 NOTE — Progress Notes (Signed)
  Subjective:  Patient ID: Megan [name], female    DOB: [birth year],  MRN: [id number]  Chief Complaint  Patient presents with  . Foot Pain    6 week follow up bilateral plantar fibroma    57 y.o. female presents with the above complaint.  States that the area still hurting the orthotics to make it better.  Would like to discuss surgical correction more pain in the left side and like to discuss removing the fibroma on that side.  Review of Systems: Negative except as noted in the HPI. Denies N/V/F/Ch.  Past Medical History:  Diagnosis Date  . Hypothyroidism   . Seasonal allergies     Current Outpatient Medications:  .  levothyroxine (SYNTHROID, LEVOTHROID) 125 MCG tablet, TAKE 1 TABLET BY MOUTH ONCE DAY BEFORE BREAKFAST, Disp: 30 tablet, Rfl: 5 .  levothyroxine (SYNTHROID, LEVOTHROID) 125 MCG tablet, TAKE 1 TABLET BY MOUTH ONCE A DAY BEFOREBREAKFAST, Disp: 30 tablet, Rfl: 5 .  mometasone (NASONEX) 50 MCG/ACT nasal spray, Place 2 sprays into the nose daily., Disp: 17 g, Rfl: 1 .  oxyCODONE-acetaminophen (PERCOCET/ROXICET) 5-325 MG tablet, , Disp: , Rfl:  .  valACYclovir (VALTREX) 1000 MG tablet, Take 2,000 mg (2 tablets) q 12 hours x 1 day., Disp: 30 tablet, Rfl: 1  Social History   Tobacco Use  Smoking Status Former Smoker  . Packs/day: 0.50  . Years: 20.00  . Pack years: 10.00  . Last attempt to quit: 11/05/1998  . Years since quitting: 18.9  Smokeless Tobacco Never Used    No Known Allergies Objective:  There were no vitals filed for this visit. There is no height or weight on file to calculate BMI. Constitutional Well developed. Well nourished.  Vascular Dorsalis pedis pulses palpable bilaterally. Posterior tibial pulses palpable bilaterally. Capillary refill normal to all digits.  No cyanosis or clubbing noted. Pedal hair growth normal.  Neurologic Normal speech. Oriented to person, place, and time. Epicritic sensation to light touch grossly present  bilaterally.  Dermatologic Nails well groomed and normal in appearance. No open wounds. No skin lesions.  Orthopedic: Normal joint ROM without pain or crepitus bilaterally. No visible deformities. Pain palpation about the left medial arch.   Radiographs: None today Assessment:   1. Plantar fibromatosis   2. Fibroma of left foot   3. Ledderhose's disease   4. BMI 30.0-30.9,adult    Plan:  Patient was evaluated and treated and all questions answered.  Plantar fibroma left foot -Educated on possible treatment options.  Offered repeat injection but patient declined.  Patient would like to discuss removal. -Patient has failed all conservative therapy and wishes to proceed with surgical intervention. All risks, benefits, and alternatives discussed with patient.  Thoroughly discussed the risk of recurrence, risk of painful plantar scar.  No guarantees given. Consent reviewed and signed by patient. -Planned procedures: Excision of plantar fibroma left foot  Return for post op.

## 2017-10-12 NOTE — Patient Instructions (Signed)
Pre-Operative Instructions  Congratulations, you have decided to take an important step towards improving your quality of life.  You can be assured that the doctors and staff at [facility] will be with you every step of the way.  Here are some important things you should know:  1. Plan to be at the surgery center/hospital at least 1 (one) hour prior to your scheduled time, unless otherwise directed by the surgical center/hospital staff.  You must have a responsible adult accompany you, remain during the surgery and drive you home.  Make sure you have directions to the surgical center/hospital to ensure you arrive on time. 2. If you are having surgery at [facility] or [facility], you will need a copy of your medical history and physical form from your family physician within one month prior to the date of surgery. We will give you a form for your primary physician to complete.  3. We make every effort to accommodate the date you request for surgery.  However, there are times where surgery dates or times have to be moved.  We will contact you as soon as possible if a change in schedule is required.   4. No aspirin/ibuprofen for one week before surgery.  If you are on aspirin, any non-steroidal anti-inflammatory medications (Mobic, Aleve, Ibuprofen) should not be taken seven (7) days prior to your surgery.  You make take Tylenol for pain prior to surgery.  5. Medications - If you are taking daily heart and blood pressure medications, seizure, reflux, allergy, asthma, anxiety, pain or diabetes medications, make sure you notify the surgery center/hospital before the day of surgery so they can tell you which medications you should take or avoid the day of surgery. 6. No food or drink after midnight the night before surgery unless directed otherwise by surgical center/hospital staff. 7. No alcoholic beverages 24-hours prior to surgery.  No smoking 24-hours prior or 24-hours after  surgery. 8. Wear loose pants or shorts. They should be loose enough to fit over bandages, boots, and casts. 9. Don't wear slip-on shoes. Sneakers are preferred. 10. Bring your boot with you to the surgery center/hospital.  Also bring crutches or a walker if your physician has prescribed it for you.  If you do not have this equipment, it will be provided for you after surgery. 11. If you have not been contacted by the surgery center/hospital by the day before your surgery, call to confirm the date and time of your surgery. 12. Leave-time from work may vary depending on the type of surgery you have.  Appropriate arrangements should be made prior to surgery with your employer. 13. Prescriptions will be provided immediately following surgery by your doctor.  Fill these as soon as possible after surgery and take the medication as directed. Pain medications will not be refilled on weekends and must be approved by the doctor. 14. Remove nail polish on the operative foot and avoid getting pedicures prior to surgery. 15. Wash the night before surgery.  The night before surgery wash the foot and leg well with water and the antibacterial soap provided. Be sure to pay special attention to beneath the toenails and in between the toes.  Wash for at least three (3) minutes. Rinse thoroughly with water and dry well with a towel.  Perform this wash unless told not to do so by your physician.  Enclosed: 1 Ice pack (please put in freezer the night before surgery)   1 Hibiclens skin cleaner     Pre-op instructions  If you have any questions regarding the instructions, please do not hesitate to call our office.  [place]: [address], , [place] [address] -- [phone]  [place]: [address] [place], [place] [address] -- [phone]  [place]: [address]  [place], [place] [address] -- [phone]  [place]: [address], [address], [place], [place] [address] -- [phone]  Website:  [email or web address] 

## 2017-10-22 ENCOUNTER — Telehealth: Payer: Self-pay | Admitting: *Deleted

## 2017-10-22 NOTE — Telephone Encounter (Signed)
"  I am supposed to be scheduled for surgery on October 23 with Dr. March Rummage.  I called the surgical center to get an arrival time.  I was told that they not have me scheduled at this time.  "We do have you down in our book as being scheduled for that day.  My co-worker, Janett Billow, helps me with the scheduling.  She may not have sent it yet.  "Do you know when she will do it?  The reason I'm asking is because I was trying to make arrangements for someone to take me.  I want to be able to give them a time.  I'd like to do it first thing in the morning."  Dr. March Rummage does no start surgery until 10 am.  Someone from the surgical center usually will give you a call a day or two prior to your surgical date.  They will give you your arrival time.  The reason I can't give you an exact time is because they may have cancellations or have diabetics/children that need to scheduled for the first time slots.  "He can't start any earlier?"  He cannot because he and Dr. Jacqualyn Posey share a day, Dr. Jacqualyn Posey has the early morning slots.  "When I talked to them at the surgical center, they said as soon as they get the information to schedule my appointment they could possibly give me an idea of a time and work something out for me.  They also made me aware that is was subject to change.  I will call them later on this week to see if it has been scheduled."

## 2017-11-14 ENCOUNTER — Other Ambulatory Visit: Payer: Self-pay | Admitting: *Deleted

## 2017-11-14 ENCOUNTER — Other Ambulatory Visit: Payer: Self-pay | Admitting: Podiatry

## 2017-11-14 DIAGNOSIS — D2122 Benign neoplasm of connective and other soft tissue of left lower limb, including hip: Secondary | ICD-10-CM | POA: Diagnosis not present

## 2017-11-14 DIAGNOSIS — M722 Plantar fascial fibromatosis: Secondary | ICD-10-CM

## 2017-11-14 DIAGNOSIS — E039 Hypothyroidism, unspecified: Secondary | ICD-10-CM | POA: Diagnosis not present

## 2017-11-14 MED ORDER — ONDANSETRON HCL 4 MG PO TABS: 4.0000 mg | ORAL_TABLET | Freq: Three times a day (TID) | ORAL | 0 refills | Status: DC | PRN

## 2017-11-14 MED ORDER — CEPHALEXIN 500 MG PO CAPS: 500.0000 mg | ORAL_CAPSULE | Freq: Two times a day (BID) | ORAL | 0 refills | Status: DC

## 2017-11-14 MED ORDER — HYDROCODONE-ACETAMINOPHEN 10-325 MG PO TABS: 1.0000 | ORAL_TABLET | ORAL | 0 refills | Status: DC | PRN

## 2017-11-14 NOTE — Progress Notes (Signed)
Per Dr. March Rummage, I placed an order for a knee scooter.  I sent a message to Raelene Bott at [facility].

## 2017-11-14 NOTE — Progress Notes (Signed)
Patient underwent outpatient surgery at [facility].  DOS: 11/14/17  Procedure: Excision of Plantar Fibroma L Foot, Injection of Ligament

## 2017-11-14 NOTE — Progress Notes (Signed)
Rx sent to pharmacy for outpatient surgery. °

## 2017-11-15 ENCOUNTER — Telehealth: Payer: Self-pay | Admitting: Podiatry

## 2017-11-15 DIAGNOSIS — M722 Plantar fascial fibromatosis: Secondary | ICD-10-CM | POA: Diagnosis not present

## 2017-11-15 NOTE — Telephone Encounter (Signed)
I had surgery with Dr. March Rummage yesterday and he was supposed to call in for me to get a knee scooter. I'm heading out of town for the weekend and would like a call back this morning. My number is [phone].

## 2017-11-15 NOTE — Telephone Encounter (Signed)
Message answered in Telephone Call 8:36am.

## 2017-11-15 NOTE — Telephone Encounter (Signed)
Patient was suppose to receive a knee scooter and she needs to know where to pick it up. Also she is unable to maker her first post op which is 11/16/2017 @ 1;15 with Dr. March Rummage. She was wondering could she come in next week to have another provider look at her feet

## 2017-11-15 NOTE — Telephone Encounter (Signed)
I informed pt her knee scooter had been ordered yesterday, I would contact [facility] this morning, and gave their [phone] to call for directions on pick up. Charmayne Sheer - scheduler set pt's appt for 11/19/2017 at 10:30am as requested by pt, due to her being out of town tomorrow. Faxed orders to [facility] again, and emailed A. Barnet Glasgow.

## 2017-11-16 ENCOUNTER — Other Ambulatory Visit: Payer: BLUE CROSS/BLUE SHIELD

## 2017-11-19 ENCOUNTER — Ambulatory Visit (INDEPENDENT_AMBULATORY_CARE_PROVIDER_SITE_OTHER): Payer: Self-pay | Admitting: Podiatry

## 2017-11-19 VITALS — Temp 99.1°F

## 2017-11-19 DIAGNOSIS — Z9889 Other specified postprocedural states: Secondary | ICD-10-CM

## 2017-11-19 DIAGNOSIS — M722 Plantar fascial fibromatosis: Secondary | ICD-10-CM

## 2017-11-20 ENCOUNTER — Other Ambulatory Visit: Payer: Self-pay | Admitting: Family Medicine

## 2017-11-20 NOTE — Progress Notes (Signed)
   Subjective:  Patient presents today status post excision of plantar fibroma left. DOS: 11/14/17. She denies any significant pain or any modifying factors. She has been using the CAM boot as directed without issue. She denies nausea, vomiting, fever or chills. Patient is here for further evaluation and treatment.    Past Medical History:  Diagnosis Date  . Hypothyroidism   . Seasonal allergies       Objective/Physical Exam Neurovascular status intact.  Skin incisions appear to be well coapted with sutures and staples intact. No sign of infectious process noted. No dehiscence. No active bleeding noted. Moderate edema noted to the surgical extremity.  Assessment: 1. s/p excision of plantar fibroma of the left foot. DOS: 11/14/17   Plan of Care:  1. Patient was evaluated. 2. Dressing changed.  3. Continue weightbearing in CAM boot.  4. Return to clinic in one week with Dr. March Rummage.    Edrick Kins, DPM [facility]  Dr. Edrick Kins, [address]                                        [place], [place] [address]                Office [phone]  Fax [phone]

## 2017-11-21 ENCOUNTER — Encounter: Payer: Self-pay | Admitting: Podiatry

## 2017-11-22 ENCOUNTER — Other Ambulatory Visit: Payer: Self-pay | Admitting: Family Medicine

## 2017-11-22 DIAGNOSIS — Z1231 Encounter for screening mammogram for malignant neoplasm of breast: Secondary | ICD-10-CM

## 2017-11-30 ENCOUNTER — Ambulatory Visit (INDEPENDENT_AMBULATORY_CARE_PROVIDER_SITE_OTHER): Payer: Self-pay | Admitting: Podiatry

## 2017-11-30 DIAGNOSIS — M722 Plantar fascial fibromatosis: Secondary | ICD-10-CM

## 2017-11-30 DIAGNOSIS — Z9889 Other specified postprocedural states: Secondary | ICD-10-CM

## 2017-12-07 ENCOUNTER — Ambulatory Visit (INDEPENDENT_AMBULATORY_CARE_PROVIDER_SITE_OTHER): Payer: BLUE CROSS/BLUE SHIELD | Admitting: Podiatry

## 2017-12-07 DIAGNOSIS — M722 Plantar fascial fibromatosis: Secondary | ICD-10-CM

## 2017-12-07 DIAGNOSIS — Z9889 Other specified postprocedural states: Secondary | ICD-10-CM

## 2017-12-07 NOTE — Progress Notes (Signed)
  Subjective:  Patient ID: Megan [name], female    DOB: April 04, 1960,  MRN: [id number]  Chief Complaint  Patient presents with  . Routine Post Op    dos 10.23.2019 Plantar Fibroma Lt " my foot doesnt hurt,I just get tingling sensations in my forefoot from time to time"     DOS: 11/14/17 Procedure: Removal plantar fibroma left foot  57 y.o. female returns for post-op check. History as above  Review of Systems: Negative except as noted in the HPI. Denies N/V/F/Ch.  Past Medical History:  Diagnosis Date  . Hypothyroidism   . Seasonal allergies     Current Outpatient Medications:  .  cephALEXin (KEFLEX) 500 MG capsule, Take 1 capsule (500 mg total) by mouth 2 (two) times daily., Disp: 14 capsule, Rfl: 0 .  HYDROcodone-acetaminophen (NORCO) 10-325 MG tablet, Take 1 tablet by mouth every 4 (four) hours as needed., Disp: 20 tablet, Rfl: 0 .  levothyroxine (SYNTHROID, LEVOTHROID) 125 MCG tablet, TAKE 1 TABLET BY MOUTH ONCE DAY BEFORE BREAKFAST, Disp: 30 tablet, Rfl: 5 .  levothyroxine (SYNTHROID, LEVOTHROID) 125 MCG tablet, TAKE 1 TABLET BY MOUTH ONCE DAILY BEFOREBREAKFAST, Disp: 30 tablet, Rfl: 5 .  mometasone (NASONEX) 50 MCG/ACT nasal spray, Place 2 sprays into the nose daily., Disp: 17 g, Rfl: 1 .  ondansetron (ZOFRAN) 4 MG tablet, Take 1 tablet (4 mg total) by mouth every 8 (eight) hours as needed for nausea or vomiting., Disp: 20 tablet, Rfl: 0 .  oxyCODONE-acetaminophen (PERCOCET/ROXICET) 5-325 MG tablet, , Disp: , Rfl:  .  valACYclovir (VALTREX) 1000 MG tablet, Take 2,000 mg (2 tablets) q 12 hours x 1 day., Disp: 30 tablet, Rfl: 1  Social History   Tobacco Use  Smoking Status Former Smoker  . Packs/day: 0.50  . Years: 20.00  . Pack years: 10.00  . Last attempt to quit: 11/05/1998  . Years since quitting: 19.1  Smokeless Tobacco Never Used    No Known Allergies Objective:  There were no vitals filed for this visit. There is no height or weight on file to calculate  BMI. Constitutional Well developed. Well nourished.  Vascular Foot warm and well perfused. Capillary refill normal to all digits.   Neurologic Normal speech. Oriented to person, place, and time. Epicritic sensation to light touch grossly present bilaterally.  Dermatologic Skin healing well without signs of infection. Skin edges well coapted without signs of infection.  Orthopedic: Slight tenderness to palpation noted about the surgical site.   Radiographs: None Assessment:   1. Plantar fibromatosis   2. Status post left foot surgery    Plan:  Patient was evaluated and treated and all questions answered.  S/p foot surgery left -Progressing as expected post-operatively. -XR: None -WB Status: WBAT in CAM Boot -Sutures: intact, to be removed next week. -Medications: None refilled today -Foot redressed.  Return in about 1 week (around 12/07/2017) for post op suture removal, Megan [name] patient.

## 2017-12-10 NOTE — Progress Notes (Signed)
Subjective: Megan [name] is a 57 y.o. is seen today in office s/p left foot plantar fibroma excision performed on November 14, 2017 with Dr. March Rummage.  She states she is doing well.  She presents today for possible suture removal.  She is remained in the cam boot.  Denies any systemic complaints such as fevers, chills, nausea, vomiting. No calf pain, chest pain, shortness of breath.   Objective: General: No acute distress, AAOx3  DP/PT pulses palpable 2/4, CRT < 3 sec to all digits.  Protective sensation intact. Motor function intact.  LEFT foot: Incision is well coapted without any evidence of dehiscence and sutures are intact. There is no surrounding erythema, ascending cellulitis, fluctuance, crepitus, malodor, drainage/purulence. There is minimal edema around the surgical site. There is minimal pain along the surgical site.  No other areas of tenderness to bilateral lower extremities.  No other open lesions or pre-ulcerative lesions.  No pain with calf compression, swelling, warmth, erythema.   Assessment and Plan:  Status post left foot plantar fibroma excision, doing well with no complications   -Treatment options discussed including all alternatives, risks, and complications -The majority the sutures removed today but I did leave a couple of them intact as the more proximal portion had some mild motion.  Antibiotic ointment was applied followed by a bandage.  Keep the dressing clean, dry, intact.  She can start to shower later this week and keep a similar dressing on. -Continue cam boot. -Ice/elevation -Pain medication as needed. -Monitor for any clinical signs or symptoms of infection and DVT/PE and directed to call the office immediately should any occur or go to the ER. -Follow-up in 1 week with Dr. March Rummage or sooner if any problems arise. In the meantime, encouraged to call the office with any questions, concerns, change in symptoms.   Celesta Gentile, DPM

## 2017-12-13 ENCOUNTER — Ambulatory Visit (INDEPENDENT_AMBULATORY_CARE_PROVIDER_SITE_OTHER): Payer: BLUE CROSS/BLUE SHIELD | Admitting: Podiatry

## 2017-12-13 DIAGNOSIS — D2121 Benign neoplasm of connective and other soft tissue of right lower limb, including hip: Secondary | ICD-10-CM | POA: Diagnosis not present

## 2017-12-13 DIAGNOSIS — Z9889 Other specified postprocedural states: Secondary | ICD-10-CM

## 2017-12-13 DIAGNOSIS — M79671 Pain in right foot: Secondary | ICD-10-CM | POA: Diagnosis not present

## 2017-12-13 DIAGNOSIS — M722 Plantar fascial fibromatosis: Secondary | ICD-10-CM | POA: Diagnosis not present

## 2017-12-13 NOTE — Patient Instructions (Signed)
Pre-Operative Instructions  Congratulations, you have decided to take an important step towards improving your quality of life.  You can be assured that the doctors and staff at [facility] will be with you every step of the way.  Here are some important things you should know:  1. Plan to be at the surgery center/hospital at least 1 (one) hour prior to your scheduled time, unless otherwise directed by the surgical center/hospital staff.  You must have a responsible adult accompany you, remain during the surgery and drive you home.  Make sure you have directions to the surgical center/hospital to ensure you arrive on time. 2. If you are having surgery at [facility] or [facility], you will need a copy of your medical history and physical form from your family physician within one month prior to the date of surgery. We will give you a form for your primary physician to complete.  3. We make every effort to accommodate the date you request for surgery.  However, there are times where surgery dates or times have to be moved.  We will contact you as soon as possible if a change in schedule is required.   4. No aspirin/ibuprofen for one week before surgery.  If you are on aspirin, any non-steroidal anti-inflammatory medications (Mobic, Aleve, Ibuprofen) should not be taken seven (7) days prior to your surgery.  You make take Tylenol for pain prior to surgery.  5. Medications - If you are taking daily heart and blood pressure medications, seizure, reflux, allergy, asthma, anxiety, pain or diabetes medications, make sure you notify the surgery center/hospital before the day of surgery so they can tell you which medications you should take or avoid the day of surgery. 6. No food or drink after midnight the night before surgery unless directed otherwise by surgical center/hospital staff. 7. No alcoholic beverages 24-hours prior to surgery.  No smoking 24-hours prior or 24-hours after  surgery. 8. Wear loose pants or shorts. They should be loose enough to fit over bandages, boots, and casts. 9. Don't wear slip-on shoes. Sneakers are preferred. 10. Bring your boot with you to the surgery center/hospital.  Also bring crutches or a walker if your physician has prescribed it for you.  If you do not have this equipment, it will be provided for you after surgery. 11. If you have not been contacted by the surgery center/hospital by the day before your surgery, call to confirm the date and time of your surgery. 12. Leave-time from work may vary depending on the type of surgery you have.  Appropriate arrangements should be made prior to surgery with your employer. 13. Prescriptions will be provided immediately following surgery by your doctor.  Fill these as soon as possible after surgery and take the medication as directed. Pain medications will not be refilled on weekends and must be approved by the doctor. 14. Remove nail polish on the operative foot and avoid getting pedicures prior to surgery. 15. Wash the night before surgery.  The night before surgery wash the foot and leg well with water and the antibacterial soap provided. Be sure to pay special attention to beneath the toenails and in between the toes.  Wash for at least three (3) minutes. Rinse thoroughly with water and dry well with a towel.  Perform this wash unless told not to do so by your physician.  Enclosed: 1 Ice pack (please put in freezer the night before surgery)   1 Hibiclens skin cleaner     Pre-op instructions  If you have any questions regarding the instructions, please do not hesitate to call our office.  [place]: [address], [place], [place] [address] -- [phone]  [place]: [address] [place], [place] [address] -- [phone]  [place]: [address]  [place], [place] [address] -- [phone]  [place]: [address], [place], [place] [address] -- [phone]  Website:  [email or web address] 

## 2017-12-16 NOTE — Progress Notes (Signed)
Subjective:    Patient ID: Megan [name], female    DOB: 06-Nov-1960, 57 y.o.   MRN: [id number]  HPI  Chief Complaint  Patient presents with  . fasting cpe    fasting cpe, sinus infection- chest congestion, sinus pressure, has green mucous coming up.    She is here for a complete physical exam and has complaints of possible sinus infection  Complains of a one week history thick purulent nasal drainage, sinus pressure, ears feeling clogged,  She recently had a sore throat and also has some post nasal drainage and cough.   Taking Mucinex.  Does not smoke.  Denies history of asthma, bronchitis, pneumonia.  Underlying allergies. Takes Claritin daily.   History of hypothyroidism- diagnosed 79 or more years ago. Denies having nodule or seeing an endocrinologist for this in the past. Has been stable on medication. No issues with medication.   Social history: Lives with husband. Had 3 adult children, works as English as a second language teacher.  Denies smoking, drinking alcohol, drug use.  She is a former smoker with a 10-pack-year history and stopped in approximately 2000 Diet: fairly unhealthy Excerise: none lately due to foot surgery   Immunizations: Tdap UTD. Will return for flu shot   Health maintenance:  Mammogram: scheduled in December 2019  Colonoscopy: 2016  Last Gynecological Exam: last year  Last Menstrual cycle: many years ago  Last Dental Exam: UTD  Last Eye Exam: last eye exam. Contact lenses.   Wears seatbelt always, uses sunscreen, smoke detectors in home and functioning, does not text while driving and feels safe in home environment.   Reviewed allergies, medications, past medical, surgical, family, and social history.    Review of Systems Review of Systems Constitutional: -fever, -chills, -sweats, -unexpected weight change,-fatigue ENT: +runny nose, + left ear pain, -sore throat Cardiology:  -chest pain, -palpitations, -edema Respiratory: + acute cough, -shortness of breath,  -wheezing Gastroenterology: -abdominal pain, -nausea, -vomiting, -diarrhea, -constipation  Hematology: -bleeding or bruising problems Musculoskeletal: -arthralgias, -myalgias, -joint swelling, -back pain Ophthalmology: -vision changes Urology: -dysuria, -difficulty urinating, -hematuria, -urinary frequency, -urgency Neurology: -headache, -weakness, -tingling, -numbness       Objective:   Physical Exam BP 120/80   Pulse 77   Temp 98.1 F (36.7 C) (Oral)   Resp 16   Ht 5' 8.25" (1.734 m)   Wt 229 lb 12.8 oz (104.2 kg)   SpO2 98%   BMI 34.69 kg/m   General Appearance:    Alert, cooperative, no distress, appears stated age  Head:    Normocephalic, without obvious abnormality, atraumatic.   Eyes:    PERRL, conjunctiva/corneas clear, EOM's intact, fundi    benign  Ears:    Right TM normal, left TM pearly and intact.  Clear fluid behind leftTM and external ear canals  Nose:   Nares with erythema, edema and thick drainage. + sinus tenderness  Throat:   Lips, mucosa, and tongue normal; teeth and gums normal  Neck:   Supple, no lymphadenopathy;  thyroid:  no   enlargement/tenderness/nodules; no carotid   bruit or JVD  Back:    Spine nontender, no curvature, ROM normal, no CVA     tenderness  Lungs:     Clear to auscultation bilaterally without wheezes, rales or     ronchi; respirations unlabored  Chest Wall:    No tenderness or deformity   Heart:    Regular rate and rhythm, S1 and S2 normal, no murmur, rub   or gallop  Breast Exam:  Declines. Mammogram ordered  Abdomen:     Soft, non-tender, nondistended, normoactive bowel sounds,    no masses, no hepatosplenomegaly  Genitalia:    Declines. Pap UTD     Extremities:   No clubbing, cyanosis or edema  Pulses:   2+ and symmetric all extremities  Skin:   Skin color, texture, turgor normal, no rashes or lesions  Lymph nodes:   Cervical, supraclavicular, and axillary nodes normal  Neurologic:   CNII-XII intact, normal strength,  sensation and gait; reflexes 2+ and symmetric throughout          Psych:   Normal mood, affect, hygiene and grooming.     Urinalysis dipstick: negative      Assessment & Plan:  Routine general medical examination at a health care facility - Plan: POCT Urinalysis DIP (Proadvantage Device), CBC with Differential/Platelet, Comprehensive metabolic panel  Hypothyroidism, unspecified type - Plan: TSH, T4, free  Seasonal allergies  Estrogen deficiency - Plan: DG Bone Density  Acute non-recurrent pansinusitis  Elevated LDL cholesterol level - Plan: Lipid panel  Medication management - Plan: TSH  She does appear to have an acute sinus infection.  We will treat this with amoxicillin.  Discussed over-the-counter symptom management.  She will let me know if she is not back to baseline after completing the antibiotic. Otherwise she appears to be doing well.  Plans to start eating healthier and get back to a regular exercise routine Seasonal allergies are well controlled with daily Claritin. She is scheduled for mammogram next month and we will also order a bone density for a baseline screening test for osteoporosis. History of elevated LDL.  Counseling done on low-fat low cholesterol diet. Hypothyroidism-check TSH and adjust dose of levothyroxine as appropriate. Follow-up pending labs.  She may return for flu shot after feeling better

## 2017-12-16 NOTE — Progress Notes (Signed)
Subjective:  Patient ID: Megan [name], female    DOB: July 13, 1960,  MRN: [id number]  Chief Complaint  Patient presents with  . Routine Post Op    1 Week Follow Up - dos 10.23.2019 Plantar Fibroma Lt -Dr. March Rummage    DOS: 11/14/17 Procedure: Removal plantar fibroma left foot  57 y.o. female returns for post-op check. History as above.  States the left side is feeling great would like to discuss planned surgery for the right foot.  Still having pain to the right foot with similar areas of fibroma formation.  Review of Systems: Negative except as noted in the HPI. Denies N/V/F/Ch.  Past Medical History:  Diagnosis Date  . Hypothyroidism   . Seasonal allergies     Current Outpatient Medications:  .  cephALEXin (KEFLEX) 500 MG capsule, Take 1 capsule (500 mg total) by mouth 2 (two) times daily., Disp: 14 capsule, Rfl: 0 .  HYDROcodone-acetaminophen (NORCO) 10-325 MG tablet, Take 1 tablet by mouth every 4 (four) hours as needed., Disp: 20 tablet, Rfl: 0 .  levothyroxine (SYNTHROID, LEVOTHROID) 125 MCG tablet, TAKE 1 TABLET BY MOUTH ONCE DAY BEFORE BREAKFAST, Disp: 30 tablet, Rfl: 5 .  levothyroxine (SYNTHROID, LEVOTHROID) 125 MCG tablet, TAKE 1 TABLET BY MOUTH ONCE DAILY BEFOREBREAKFAST, Disp: 30 tablet, Rfl: 5 .  mometasone (NASONEX) 50 MCG/ACT nasal spray, Place 2 sprays into the nose daily., Disp: 17 g, Rfl: 1 .  ondansetron (ZOFRAN) 4 MG tablet, Take 1 tablet (4 mg total) by mouth every 8 (eight) hours as needed for nausea or vomiting., Disp: 20 tablet, Rfl: 0 .  oxyCODONE-acetaminophen (PERCOCET/ROXICET) 5-325 MG tablet, , Disp: , Rfl:  .  triamcinolone cream (KENALOG) 0.1 %, triamcinolone acetonide 0.1 % topical cream, Disp: , Rfl:  .  valACYclovir (VALTREX) 1000 MG tablet, Take 2,000 mg (2 tablets) q 12 hours x 1 day., Disp: 30 tablet, Rfl: 1  Social History   Tobacco Use  Smoking Status Former Smoker  . Packs/day: 0.50  . Years: 20.00  . Pack years: 10.00  . Last attempt to  quit: 11/05/1998  . Years since quitting: 19.1  Smokeless Tobacco Never Used    No Known Allergies Objective:  There were no vitals filed for this visit. There is no height or weight on file to calculate BMI. Constitutional Well developed. Well nourished.  Vascular Foot warm and well perfused. Capillary refill normal to all digits.   Neurologic Normal speech. Oriented to person, place, and time. Epicritic sensation to light touch grossly present bilaterally.  Dermatologic Skin healing well without signs of infection. Skin edges well coapted without signs of infection.  Orthopedic: Slight tenderness to palpation noted about the surgical site.   Radiographs: None Assessment:   1. Plantar fibromatosis   2. Status post left foot surgery   3. Plantar fascial fibromatosis of right foot   4. Fibroma of foot, right   5. Pain in right foot   6. Ledderhose's disease    Plan:  Patient was evaluated and treated and all questions answered.  S/p foot surgery left -Progressing as expected post-operatively. -XR: None -WB Status: WBAT in surgical shoe. -Sutures: removed. Steris applied. -Medications: None refilled today -Foot redressed.  Plantar Fibroma Right -Patient would like to proceed with planned surgery on the right side.  Has failed all conservative therapy and wishes to proceed. -Patient has failed all conservative therapy and wishes to proceed with surgical intervention. All risks, benefits, and alternatives discussed with patient. No guarantees given. Consent reviewed  and signed by patient. -Planned procedures: Excision plantar fibroma right   Return in about 2 weeks (around 12/27/2017) for Post-op, Left.

## 2017-12-16 NOTE — Patient Instructions (Addendum)
Take the antibiotic as prescribed. If you are not back to baseline after completing it, let me know.   Call and schedule your bone density test. This can be done along with your mammogram if you ask.   We will call you with your lab results.    Preventive Care 40-64 Years, Female Preventive care refers to lifestyle choices and visits with your health care provider that can promote health and wellness. What does preventive care include?  A yearly physical exam. This is also called an annual well check.  Dental exams once or twice a year.  Routine eye exams. Ask your health care provider how often you should have your eyes checked.  Personal lifestyle choices, including: ? Daily care of your teeth and gums. ? Regular physical activity. ? Eating a healthy diet. ? Avoiding tobacco and drug use. ? Limiting alcohol use. ? Practicing safe sex. ? Taking low-dose aspirin daily starting at age 64. ? Taking vitamin and mineral supplements as recommended by your health care provider. What happens during an annual well check? The services and screenings done by your health care provider during your annual well check will depend on your age, overall health, lifestyle risk factors, and family history of disease. Counseling Your health care provider may ask you questions about your:  Alcohol use.  Tobacco use.  Drug use.  Emotional well-being.  Home and relationship well-being.  Sexual activity.  Eating habits.  Work and work Statistician.  Method of birth control.  Menstrual cycle.  Pregnancy history.  Screening You may have the following tests or measurements:  Height, weight, and BMI.  Blood pressure.  Lipid and cholesterol levels. These may be checked every 5 years, or more frequently if you are over 91 years old.  Skin check.  Lung cancer screening. You may have this screening every year starting at age 17 if you have a 30-pack-year history of smoking and currently  smoke or have quit within the past 15 years.  Fecal occult blood test (FOBT) of the stool. You may have this test every year starting at age 36.  Flexible sigmoidoscopy or colonoscopy. You may have a sigmoidoscopy every 5 years or a colonoscopy every 10 years starting at age 57.  Hepatitis C blood test.  Hepatitis B blood test.  Sexually transmitted disease (STD) testing.  Diabetes screening. This is done by checking your blood sugar (glucose) after you have not eaten for a while (fasting). You may have this done every 1-3 years.  Mammogram. This may be done every 1-2 years. Talk to your health care provider about when you should start having regular mammograms. This may depend on whether you have a family history of breast cancer.  BRCA-related cancer screening. This may be done if you have a family history of breast, ovarian, tubal, or peritoneal cancers.  Pelvic exam and Pap test. This may be done every 3 years starting at age 31. Starting at age 27, this may be done every 5 years if you have a Pap test in combination with an HPV test.  Bone density scan. This is done to screen for osteoporosis. You may have this scan if you are at high risk for osteoporosis.  Discuss your test results, treatment options, and if necessary, the need for more tests with your health care provider. Vaccines Your health care provider may recommend certain vaccines, such as:  Influenza vaccine. This is recommended every year.  Tetanus, diphtheria, and acellular pertussis (Tdap, Td) vaccine. You may  need a Td booster every 10 years.  Varicella vaccine. You may need this if you have not been vaccinated.  Zoster vaccine. You may need this after age 46.  Measles, mumps, and rubella (MMR) vaccine. You may need at least one dose of MMR if you were born in 1957 or later. You may also need a second dose.  Pneumococcal 13-valent conjugate (PCV13) vaccine. You may need this if you have certain conditions and  were not previously vaccinated.  Pneumococcal polysaccharide (PPSV23) vaccine. You may need one or two doses if you smoke cigarettes or if you have certain conditions.  Meningococcal vaccine. You may need this if you have certain conditions.  Hepatitis A vaccine. You may need this if you have certain conditions or if you travel or work in places where you may be exposed to hepatitis A.  Hepatitis B vaccine. You may need this if you have certain conditions or if you travel or work in places where you may be exposed to hepatitis B.  Haemophilus influenzae type b (Hib) vaccine. You may need this if you have certain conditions.  Talk to your health care provider about which screenings and vaccines you need and how often you need them. This information is not intended to replace advice given to you by your health care provider. Make sure you discuss any questions you have with your health care provider. Document Released: 02/05/2015 Document Revised: 09/29/2015 Document Reviewed: 11/10/2014 Elsevier Interactive Patient Education  [employer].

## 2017-12-17 ENCOUNTER — Ambulatory Visit (INDEPENDENT_AMBULATORY_CARE_PROVIDER_SITE_OTHER): Payer: BLUE CROSS/BLUE SHIELD | Admitting: Family Medicine

## 2017-12-17 ENCOUNTER — Encounter: Payer: Self-pay | Admitting: Family Medicine

## 2017-12-17 VITALS — BP 120/80 | HR 77 | Temp 98.1°F | Resp 16 | Ht 68.25 in | Wt 229.8 lb

## 2017-12-17 DIAGNOSIS — E7800 Pure hypercholesterolemia, unspecified: Secondary | ICD-10-CM

## 2017-12-17 DIAGNOSIS — E2839 Other primary ovarian failure: Secondary | ICD-10-CM

## 2017-12-17 DIAGNOSIS — J302 Other seasonal allergic rhinitis: Secondary | ICD-10-CM | POA: Diagnosis not present

## 2017-12-17 DIAGNOSIS — Z0000 Encounter for general adult medical examination without abnormal findings: Secondary | ICD-10-CM

## 2017-12-17 DIAGNOSIS — J0140 Acute pansinusitis, unspecified: Secondary | ICD-10-CM

## 2017-12-17 DIAGNOSIS — E039 Hypothyroidism, unspecified: Secondary | ICD-10-CM | POA: Diagnosis not present

## 2017-12-17 DIAGNOSIS — Z79899 Other long term (current) drug therapy: Secondary | ICD-10-CM | POA: Diagnosis not present

## 2017-12-17 HISTORY — DX: Pure hypercholesterolemia, unspecified: E78.00

## 2017-12-17 LAB — POCT URINALYSIS DIP (PROADVANTAGE DEVICE)
BILIRUBIN UA: NEGATIVE mg/dL
Bilirubin, UA: NEGATIVE
Blood, UA: NEGATIVE
GLUCOSE UA: NEGATIVE mg/dL
Leukocytes, UA: NEGATIVE
Nitrite, UA: NEGATIVE
Protein Ur, POC: NEGATIVE mg/dL
SPECIFIC GRAVITY, URINE: 1.01
Urobilinogen, Ur: NEGATIVE
pH, UA: 6 (ref 5.0–8.0)

## 2017-12-17 MED ORDER — AMOXICILLIN 875 MG PO TABS: 875.0000 mg | ORAL_TABLET | Freq: Two times a day (BID) | ORAL | 0 refills | Status: DC

## 2017-12-17 NOTE — Addendum Note (Signed)
Addended by: Girtha Rm on: 12/17/2017 03:15 PM   Modules accepted: Orders

## 2017-12-18 ENCOUNTER — Other Ambulatory Visit: Payer: Self-pay | Admitting: Internal Medicine

## 2017-12-18 DIAGNOSIS — E039 Hypothyroidism, unspecified: Secondary | ICD-10-CM

## 2017-12-18 LAB — CBC WITH DIFFERENTIAL/PLATELET
Basophils Absolute: 0.1 10*3/uL (ref 0.0–0.2)
Basos: 1 %
EOS (ABSOLUTE): 0.2 10*3/uL (ref 0.0–0.4)
EOS: 2 %
HEMATOCRIT: 46.4 % (ref 34.0–46.6)
HEMOGLOBIN: 15.4 g/dL (ref 11.1–15.9)
Immature Grans (Abs): 0 10*3/uL (ref 0.0–0.1)
Immature Granulocytes: 1 %
LYMPHS ABS: 2.6 10*3/uL (ref 0.7–3.1)
LYMPHS: 31 %
MCH: 30.8 pg (ref 26.6–33.0)
MCHC: 33.2 g/dL (ref 31.5–35.7)
MCV: 93 fL (ref 79–97)
MONOCYTES: 9 %
Monocytes Absolute: 0.7 10*3/uL (ref 0.1–0.9)
NEUTROS ABS: 4.9 10*3/uL (ref 1.4–7.0)
Neutrophils: 56 %
Platelets: 372 10*3/uL (ref 150–450)
RBC: 5 x10E6/uL (ref 3.77–5.28)
RDW: 11.9 % — ABNORMAL LOW (ref 12.3–15.4)
WBC: 8.5 10*3/uL (ref 3.4–10.8)

## 2017-12-18 LAB — COMPREHENSIVE METABOLIC PANEL
ALK PHOS: 90 IU/L (ref 39–117)
ALT: 27 IU/L (ref 0–32)
AST: 19 IU/L (ref 0–40)
Albumin/Globulin Ratio: 1.8 (ref 1.2–2.2)
Albumin: 4.8 g/dL (ref 3.5–5.5)
BUN/Creatinine Ratio: 19 (ref 9–23)
BUN: 18 mg/dL (ref 6–24)
Bilirubin Total: <0.2 mg/dL (ref 0.0–1.2)
CALCIUM: 9.8 mg/dL (ref 8.7–10.2)
CO2: 20 mmol/L (ref 20–29)
CREATININE: 0.95 mg/dL (ref 0.57–1.00)
Chloride: 99 mmol/L (ref 96–106)
GFR calc Af Amer: 78 mL/min/{1.73_m2} (ref >59)
GFR calc non Af Amer: 68 mL/min/{1.73_m2} (ref >59)
GLOBULIN, TOTAL: 2.7 g/dL (ref 1.5–4.5)
Glucose: 94 mg/dL (ref 65–99)
Potassium: 4.6 mmol/L (ref 3.5–5.2)
SODIUM: 136 mmol/L (ref 134–144)
Total Protein: 7.5 g/dL (ref 6.0–8.5)

## 2017-12-18 LAB — LIPID PANEL
Chol/HDL Ratio: 3.8 ratio (ref 0.0–4.4)
Cholesterol, Total: 211 mg/dL — ABNORMAL HIGH (ref 100–199)
HDL: 55 mg/dL (ref >39)
LDL Calculated: 117 mg/dL — ABNORMAL HIGH (ref 0–99)
Triglycerides: 195 mg/dL — ABNORMAL HIGH (ref 0–149)
VLDL CHOLESTEROL CAL: 39 mg/dL (ref 5–40)

## 2017-12-18 LAB — TSH: TSH: 4.54 u[IU]/mL — ABNORMAL HIGH (ref 0.450–4.500)

## 2017-12-18 LAB — T4, FREE: Free T4: 1.31 ng/dL (ref 0.82–1.77)

## 2017-12-26 ENCOUNTER — Other Ambulatory Visit (INDEPENDENT_AMBULATORY_CARE_PROVIDER_SITE_OTHER): Payer: BLUE CROSS/BLUE SHIELD

## 2017-12-26 DIAGNOSIS — Z23 Encounter for immunization: Secondary | ICD-10-CM

## 2017-12-28 ENCOUNTER — Ambulatory Visit (INDEPENDENT_AMBULATORY_CARE_PROVIDER_SITE_OTHER): Payer: Self-pay

## 2017-12-28 DIAGNOSIS — Z9889 Other specified postprocedural states: Secondary | ICD-10-CM

## 2017-12-28 DIAGNOSIS — M722 Plantar fascial fibromatosis: Secondary | ICD-10-CM

## 2018-01-01 NOTE — Progress Notes (Signed)
Patient is here today for her follow-up appointment, date of surgery 11/14/2017, removal of plantar fibroma left foot.  She is currently in a regular shoe and says that there is no pain when wearing the shoe.  Overall she feels like everything is healing well.  Well-healing surgical incision, no redness, no erythema, no drainage, no swelling, no other signs and symptoms of infection.  Patient is to follow-up as needed with any acute symptom changes, or to discuss surgery for her right foot with Dr. March Rummage.

## 2018-01-03 ENCOUNTER — Ambulatory Visit: Payer: BLUE CROSS/BLUE SHIELD

## 2018-01-04 ENCOUNTER — Other Ambulatory Visit: Payer: Self-pay | Admitting: Family Medicine

## 2018-01-04 DIAGNOSIS — B001 Herpesviral vesicular dermatitis: Secondary | ICD-10-CM

## 2018-01-04 NOTE — Telephone Encounter (Signed)
Is this okay to refill? 

## 2018-01-09 ENCOUNTER — Other Ambulatory Visit: Payer: Self-pay | Admitting: *Deleted

## 2018-01-09 ENCOUNTER — Other Ambulatory Visit: Payer: Self-pay | Admitting: Podiatry

## 2018-01-09 DIAGNOSIS — M722 Plantar fascial fibromatosis: Secondary | ICD-10-CM | POA: Diagnosis not present

## 2018-01-09 DIAGNOSIS — D2121 Benign neoplasm of connective and other soft tissue of right lower limb, including hip: Secondary | ICD-10-CM | POA: Diagnosis not present

## 2018-01-09 DIAGNOSIS — E039 Hypothyroidism, unspecified: Secondary | ICD-10-CM | POA: Diagnosis not present

## 2018-01-09 DIAGNOSIS — D492 Neoplasm of unspecified behavior of bone, soft tissue, and skin: Secondary | ICD-10-CM | POA: Diagnosis not present

## 2018-01-09 MED ORDER — ONDANSETRON HCL 4 MG PO TABS: 4.0000 mg | ORAL_TABLET | Freq: Three times a day (TID) | ORAL | 0 refills | Status: DC | PRN

## 2018-01-09 MED ORDER — HYDROCODONE-ACETAMINOPHEN 10-325 MG PO TABS: 1.0000 | ORAL_TABLET | ORAL | 0 refills | Status: DC | PRN

## 2018-01-09 MED ORDER — CEPHALEXIN 500 MG PO CAPS: 500.0000 mg | ORAL_CAPSULE | Freq: Two times a day (BID) | ORAL | 0 refills | Status: DC

## 2018-01-09 NOTE — Progress Notes (Signed)
Rx sent to pharmacy for outpatient surgery. °

## 2018-01-09 NOTE — Progress Notes (Signed)
Per Dr. March Rummage, I placed an order for a knee scooter.  I contacted [facility] to assist the patient in getting the scooter.

## 2018-01-10 ENCOUNTER — Telehealth: Payer: Self-pay

## 2018-01-11 ENCOUNTER — Ambulatory Visit (INDEPENDENT_AMBULATORY_CARE_PROVIDER_SITE_OTHER): Payer: BLUE CROSS/BLUE SHIELD | Admitting: Podiatry

## 2018-01-11 DIAGNOSIS — M722 Plantar fascial fibromatosis: Secondary | ICD-10-CM | POA: Diagnosis not present

## 2018-01-11 NOTE — Telephone Encounter (Signed)
Left message to please call our office with any questions or problems after surgery.

## 2018-01-13 DIAGNOSIS — M722 Plantar fascial fibromatosis: Secondary | ICD-10-CM | POA: Insufficient documentation

## 2018-01-13 NOTE — Progress Notes (Signed)
Subjective: Megan [name] is a 57 y.o. is seen today in office s/p right foot plantar fibroma excision preformed on 01/09/2018 with Dr. March Rummage.  She states that she is doing well.  She gets a stinging sensation at times but this is intermittent.  She has been in the cam boot nonweightbearing.  Hydrocodone is making her itch so she has stopped.  Her pain is controlled currently.  Denies any systemic complaints such as fevers, chills, nausea, vomiting. No calf pain, chest pain, shortness of breath.   Objective: General: No acute distress, AAOx3  DP/PT pulses palpable 2/4, CRT < 3 sec to all digits.  Protective sensation intact. Motor function intact.  RIGHT foot: Incision is well coapted without any evidence of dehiscence and sutures are intact.  There is a faint rim of erythema around the incision site but this is likely more from inflammation as opposed to infection.  There is no drainage or pus there is no fluctuation or crepitation or malodor.   No other areas of tenderness to bilateral lower extremities.  No other open lesions or pre-ulcerative lesions.  No pain with calf compression, swelling, warmth, erythema.   Assessment and Plan:  Status post right foot plantar fibroma excision, doing well with no complications   -Treatment options discussed including all alternatives, risks, and complications -Antibiotic ointment and a bandage was applied.  Keep the dressing clean, dry, intact. -Finish course of antibiotics. -Ice/elevation -Pain medication as needed. -Monitor for any clinical signs or symptoms of infection and DVT/PE and directed to call the office immediately should any occur or go to the ER. -Follow-up 1 week with Dr. March Rummage or sooner if any problems arise. In the meantime, encouraged to call the office with any questions, concerns, change in symptoms.   Celesta Gentile, DPM

## 2018-01-18 ENCOUNTER — Encounter: Payer: Self-pay | Admitting: Podiatry

## 2018-01-24 ENCOUNTER — Ambulatory Visit (INDEPENDENT_AMBULATORY_CARE_PROVIDER_SITE_OTHER): Payer: BLUE CROSS/BLUE SHIELD | Admitting: Podiatry

## 2018-01-24 DIAGNOSIS — M722 Plantar fascial fibromatosis: Secondary | ICD-10-CM

## 2018-01-24 NOTE — Progress Notes (Signed)
  Subjective:  Patient ID: Megan [name], female    DOB: 07-26-1960,  MRN: [id number]  Chief Complaint  Patient presents with  . Routine Post Op    dos 12.18.2019 Plantar Fibroma Rt " my foot feels great, I havent had any nerve pain"     DOS: 11/14/17 Procedure: Removal plantar fibroma left foot  DOS: 01/09/18 Procedure: Removal plantar fibroma right foot  58 y.o. female returns for post-op check. History as above.  States the foot feels great denies any postoperative issues has been weightbearing in her shoe despite being told to be nonweightbearing  Review of Systems: Negative except as noted in the HPI. Denies N/V/F/Ch.  Past Medical History:  Diagnosis Date  . Hypothyroidism   . Seasonal allergies     Current Outpatient Medications:  .  amoxicillin (AMOXIL) 875 MG tablet, Take 1 tablet (875 mg total) by mouth 2 (two) times daily., Disp: 20 tablet, Rfl: 0 .  cephALEXin (KEFLEX) 500 MG capsule, Take 1 capsule (500 mg total) by mouth 2 (two) times daily., Disp: 14 capsule, Rfl: 0 .  HYDROcodone-acetaminophen (NORCO) 10-325 MG tablet, Take 1 tablet by mouth every 4 (four) hours as needed., Disp: 20 tablet, Rfl: 0 .  levothyroxine (SYNTHROID, LEVOTHROID) 125 MCG tablet, TAKE 1 TABLET BY MOUTH ONCE DAY BEFORE BREAKFAST, Disp: 30 tablet, Rfl: 5 .  ondansetron (ZOFRAN) 4 MG tablet, Take 1 tablet (4 mg total) by mouth every 8 (eight) hours as needed for nausea or vomiting., Disp: 20 tablet, Rfl: 0 .  triamcinolone cream (KENALOG) 0.1 %, triamcinolone acetonide 0.1 % topical cream, Disp: , Rfl:  .  valACYclovir (VALTREX) 1000 MG tablet, TAKE 2 TABLETS BY MOUTH EVERY 12 HOURS FOR 1 DAY, Disp: 30 tablet, Rfl: 1  Social History   Tobacco Use  Smoking Status Former Smoker  . Packs/day: 0.50  . Years: 20.00  . Pack years: 10.00  . Last attempt to quit: 11/05/1998  . Years since quitting: 19.2  Smokeless Tobacco Never Used    No Known Allergies Objective:  There were no vitals  filed for this visit. There is no height or weight on file to calculate BMI. Constitutional Well developed. Well nourished.  Vascular Foot warm and well perfused. Capillary refill normal to all digits.   Neurologic Normal speech. Oriented to person, place, and time. Epicritic sensation to light touch grossly present bilaterally.  Dermatologic Skin healing well without signs of infection. Skin edges well coapted without signs of infection.  Orthopedic:  No tenderness to palpation noted about the surgical site.   Radiographs: None Assessment:   1. Plantar fibromatosis    Plan:  Patient was evaluated and treated and all questions answered.  S/p foot surgery right -Progressing as expected post-operatively. -XR: None -WB Status: WBAT in surgical shoe. -Sutures: Intact.  Okay to shower at this point we will remove next week -Medications: None refilled today -Foot redressed.  Return in about 1 week (around 01/31/2018) for price patient.

## 2018-01-31 ENCOUNTER — Ambulatory Visit (INDEPENDENT_AMBULATORY_CARE_PROVIDER_SITE_OTHER): Payer: BLUE CROSS/BLUE SHIELD | Admitting: Podiatry

## 2018-01-31 DIAGNOSIS — Z9889 Other specified postprocedural states: Secondary | ICD-10-CM

## 2018-01-31 DIAGNOSIS — M722 Plantar fascial fibromatosis: Secondary | ICD-10-CM

## 2018-02-03 NOTE — Progress Notes (Signed)
  Subjective:  Patient ID: Megan [name], female    DOB: Aug 08, 1960,  MRN: [id number]  Chief Complaint  Patient presents with  . Routine Post Op    pov#3 dos 12.18.2019 Plantar Fibroma Rt -Dr. March Rummage    DOS: 11/14/17 Procedure: Removal plantar fibroma left foot  DOS: 01/09/18 Procedure: Removal plantar fibroma right foot  58 y.o. female returns for post-op check. History as above.  Doing well denies pain.  Review of Systems: Negative except as noted in the HPI. Denies N/V/F/Ch.  Past Medical History:  Diagnosis Date  . Hypothyroidism   . Seasonal allergies     Current Outpatient Medications:  .  amoxicillin (AMOXIL) 875 MG tablet, Take 1 tablet (875 mg total) by mouth 2 (two) times daily., Disp: 20 tablet, Rfl: 0 .  cephALEXin (KEFLEX) 500 MG capsule, Take 1 capsule (500 mg total) by mouth 2 (two) times daily., Disp: 14 capsule, Rfl: 0 .  HYDROcodone-acetaminophen (NORCO) 10-325 MG tablet, Take 1 tablet by mouth every 4 (four) hours as needed., Disp: 20 tablet, Rfl: 0 .  levothyroxine (SYNTHROID, LEVOTHROID) 125 MCG tablet, TAKE 1 TABLET BY MOUTH ONCE DAY BEFORE BREAKFAST, Disp: 30 tablet, Rfl: 5 .  ondansetron (ZOFRAN) 4 MG tablet, Take 1 tablet (4 mg total) by mouth every 8 (eight) hours as needed for nausea or vomiting., Disp: 20 tablet, Rfl: 0 .  triamcinolone cream (KENALOG) 0.1 %, triamcinolone acetonide 0.1 % topical cream, Disp: , Rfl:  .  valACYclovir (VALTREX) 1000 MG tablet, TAKE 2 TABLETS BY MOUTH EVERY 12 HOURS FOR 1 DAY, Disp: 30 tablet, Rfl: 1  Social History   Tobacco Use  Smoking Status Former Smoker  . Packs/day: 0.50  . Years: 20.00  . Pack years: 10.00  . Last attempt to quit: 11/05/1998  . Years since quitting: 19.2  Smokeless Tobacco Never Used    No Known Allergies Objective:  There were no vitals filed for this visit. There is no height or weight on file to calculate BMI. Constitutional Well developed. Well nourished.  Vascular Foot warm  and well perfused. Capillary refill normal to all digits.   Neurologic Normal speech. Oriented to person, place, and time. Epicritic sensation to light touch grossly present bilaterally.  Dermatologic Skin healing well without signs of infection. Skin edges well coapted without signs of infection.  Orthopedic:  No tenderness to palpation noted about the surgical site.   Radiographs: None Assessment:   1. Plantar fibromatosis   2. Status post left foot surgery    Plan:  Patient was evaluated and treated and all questions answered.  S/p foot surgery right -Progressing as expected post-operatively. -XR: None -WB Status: WBAT in surgical shoe. -Sutures: Intact.  out -Medications: None refilled today -Foot redressed.  No follow-ups on file.

## 2018-02-13 ENCOUNTER — Ambulatory Visit: Payer: BLUE CROSS/BLUE SHIELD

## 2018-02-13 ENCOUNTER — Other Ambulatory Visit: Payer: BLUE CROSS/BLUE SHIELD

## 2018-02-18 ENCOUNTER — Ambulatory Visit: Payer: BLUE CROSS/BLUE SHIELD | Admitting: Medical

## 2018-02-18 ENCOUNTER — Encounter: Payer: Self-pay | Admitting: Medical

## 2018-02-18 VITALS — BP 130/80 | HR 75 | Temp 98.0°F | Resp 16 | Ht 69.0 in | Wt 238.2 lb

## 2018-02-18 DIAGNOSIS — J988 Other specified respiratory disorders: Secondary | ICD-10-CM | POA: Diagnosis not present

## 2018-02-18 DIAGNOSIS — R05 Cough: Secondary | ICD-10-CM | POA: Diagnosis not present

## 2018-02-18 DIAGNOSIS — R062 Wheezing: Secondary | ICD-10-CM | POA: Diagnosis not present

## 2018-02-18 DIAGNOSIS — R059 Cough, unspecified: Secondary | ICD-10-CM

## 2018-02-18 MED ORDER — AZITHROMYCIN 250 MG PO TABS: ORAL_TABLET | ORAL | 0 refills | Status: DC

## 2018-02-18 MED ORDER — ALBUTEROL SULFATE HFA 108 (90 BASE) MCG/ACT IN AERS: 2.0000 | INHALATION_SPRAY | Freq: Four times a day (QID) | RESPIRATORY_TRACT | 0 refills | Status: DC | PRN

## 2018-02-18 NOTE — Patient Instructions (Signed)
Recommendations:  Begin Zpak antibiotic daily for 5 days  You can use Afrin nasal spray up to twice daily for 3 days  Continue to drink plenty of water throughout the day  continue Mucinex DM the next 3-5 days  Use Albuterol inhaler, 2 puffs three times daily the next 5 days   Rest, don't do too much strenuous activity the next few days  If not much improved within a week, call or return

## 2018-02-18 NOTE — Progress Notes (Signed)
Subjective: Chief Complaint  Patient presents with  . congestion    head and chest congestion X 1 week fatigue cough   Here for head and chest congestion x 1+ weeks, feeling fatigue, somewhat winded, productive cough, ears feel clogged.  No fever, no nausea or vomiting.  No sore throat.  Using some mucinex.   +sick contacts at work.  No hx/o asthma.  Nonsmoker.   No other aggravating or relieving factors. No other complaint.  Past Medical History:  Diagnosis Date  . Hypothyroidism   . Seasonal allergies    Current Outpatient Medications on File Prior to Visit  Medication Sig Dispense Refill  . levothyroxine (SYNTHROID, LEVOTHROID) 125 MCG tablet TAKE 1 TABLET BY MOUTH ONCE DAY BEFORE BREAKFAST 30 tablet 5  . valACYclovir (VALTREX) 1000 MG tablet TAKE 2 TABLETS BY MOUTH EVERY 12 HOURS FOR 1 DAY 30 tablet 1  . triamcinolone cream (KENALOG) 0.1 % triamcinolone acetonide 0.1 % topical cream     No current facility-administered medications on file prior to visit.    ROS as in subjective   Objective; BP 130/80   Pulse 75   Temp 98 F (36.7 C) (Oral)   Resp 16   Ht 5\' 9"  (1.753 m)   Wt 238 lb 3.2 oz (108 kg)   SpO2 96%   BMI 35.18 kg/m   Gen: wd, wn, nad Skin: unremarkable HENT - head nontneder, non tender, right nare with swollen turbinates, clear discharge, TMs with serous fluids behind TMs pharynx normal, oral MMM, no lesions Lungs: left upper fields with rhonchi, otherwise clear, no wheezing Heart rrr, normla s1, s2, no murmurs Ext: no edema   Assessment: Encounter Diagnoses  Name Primary?  . Cough Yes  . Respiratory infection   . Wheezing      Plan: Discussed symptoms, recommendations below, and follow up if not improving.    Patient Instructions  Recommendations:  Begin Zpak antibiotic daily for 5 days  You can use Afrin nasal spray up to twice daily for 3 days  Continue to drink plenty of water throughout the day  continue Mucinex DM the next 3-5  days  Use Albuterol inhaler, 2 puffs three times daily the next 5 days   Rest, don't do too much strenuous activity the next few days  If not much improved within a week, call or return    [place]a was seen today for congestion.  Diagnoses and all orders for this visit:  Cough  Respiratory infection  Wheezing  Other orders -     azithromycin (ZITHROMAX) 250 MG tablet; 2 tablets day 1, then 1 tablet days 2-4 -     albuterol (PROVENTIL HFA;VENTOLIN HFA) 108 (90 Base) MCG/ACT inhaler; Inhale 2 puffs into the lungs every 6 (six) hours as needed for wheezing or shortness of breath.

## 2018-02-28 ENCOUNTER — Ambulatory Visit (INDEPENDENT_AMBULATORY_CARE_PROVIDER_SITE_OTHER): Payer: BLUE CROSS/BLUE SHIELD | Admitting: Podiatry

## 2018-02-28 DIAGNOSIS — M722 Plantar fascial fibromatosis: Secondary | ICD-10-CM

## 2018-02-28 DIAGNOSIS — Z9889 Other specified postprocedural states: Secondary | ICD-10-CM

## 2018-03-01 NOTE — Progress Notes (Signed)
  Subjective:  Patient ID: Megan [name], female    DOB: [birth year],  MRN: [id number]  Chief Complaint  Patient presents with  . Routine Post Op    DOS 12.18.19 Plantar Fibroma Right. Pt states healing well but would like to discuss the "hardness" of the incision site.     DOS: 11/14/17 Procedure: Removal plantar fibroma left foot  DOS: 01/09/18 Procedure: Removal plantar fibroma right foot  58 y.o. female returns for post-op check. History as above.  Doing well denies pain.  Review of Systems: Negative except as noted in the HPI. Denies N/V/F/Ch.  Past Medical History:  Diagnosis Date  . Hypothyroidism   . Seasonal allergies     Current Outpatient Medications:  .  albuterol (PROVENTIL HFA;VENTOLIN HFA) 108 (90 Base) MCG/ACT inhaler, Inhale 2 puffs into the lungs every 6 (six) hours as needed for wheezing or shortness of breath., Disp: 1 Inhaler, Rfl: 0 .  azithromycin (ZITHROMAX) 250 MG tablet, 2 tablets day 1, then 1 tablet days 2-4, Disp: 6 tablet, Rfl: 0 .  levothyroxine (SYNTHROID, LEVOTHROID) 125 MCG tablet, TAKE 1 TABLET BY MOUTH ONCE DAY BEFORE BREAKFAST, Disp: 30 tablet, Rfl: 5 .  triamcinolone cream (KENALOG) 0.1 %, triamcinolone acetonide 0.1 % topical cream, Disp: , Rfl:  .  valACYclovir (VALTREX) 1000 MG tablet, TAKE 2 TABLETS BY MOUTH EVERY 12 HOURS FOR 1 DAY, Disp: 30 tablet, Rfl: 1  Social History   Tobacco Use  Smoking Status Former Smoker  . Packs/day: 0.50  . Years: 20.00  . Pack years: 10.00  . Last attempt to quit: 11/05/1998  . Years since quitting: 19.3  Smokeless Tobacco Never Used    No Known Allergies Objective:  There were no vitals filed for this visit. There is no height or weight on file to calculate BMI. Constitutional Well developed. Well nourished.  Vascular Foot warm and well perfused. Capillary refill normal to all digits.   Neurologic Normal speech. Oriented to person, place, and time. Epicritic sensation to light touch  grossly present bilaterally.  Dermatologic Skin well-healed slight scar tissue around the incision sites  Orthopedic:  No tenderness to palpation noted about the surgical site.   Radiographs: None Assessment:   1. Plantar fibromatosis   2. Post-operative state    Plan:  Patient was evaluated and treated and all questions answered.  S/p foot surgery bilat -Doing well still with some scar tissue around the incision but she is not having any pain or discomfort and is pleased with the results.  She is ambulating her normal shoes without issues advised to continue scar tissue massage to the areas and follow-up as needed.  No follow-ups on file.

## 2018-03-18 ENCOUNTER — Other Ambulatory Visit: Payer: BLUE CROSS/BLUE SHIELD

## 2018-03-18 DIAGNOSIS — E039 Hypothyroidism, unspecified: Secondary | ICD-10-CM

## 2018-03-19 ENCOUNTER — Other Ambulatory Visit: Payer: Self-pay | Admitting: Internal Medicine

## 2018-03-19 LAB — TSH: TSH: 1.98 u[IU]/mL (ref 0.450–4.500)

## 2018-03-19 MED ORDER — LEVOTHYROXINE SODIUM 125 MCG PO TABS: ORAL_TABLET | ORAL | 5 refills | Status: DC

## 2018-03-26 ENCOUNTER — Ambulatory Visit
Admission: RE | Admit: 2018-03-26 | Discharge: 2018-03-26 | Disposition: A | Discharge status: Home / Self Care | Payer: BLUE CROSS/BLUE SHIELD | Source: Ambulatory Visit | Attending: Family Medicine | Admitting: Family Medicine

## 2018-03-26 DIAGNOSIS — Z1231 Encounter for screening mammogram for malignant neoplasm of breast: Secondary | ICD-10-CM

## 2018-03-26 DIAGNOSIS — Z13820 Encounter for screening for osteoporosis: Secondary | ICD-10-CM | POA: Diagnosis not present

## 2018-03-26 DIAGNOSIS — Z780 Asymptomatic menopausal state: Secondary | ICD-10-CM | POA: Diagnosis not present

## 2018-03-26 DIAGNOSIS — E2839 Other primary ovarian failure: Secondary | ICD-10-CM

## 2018-09-20 ENCOUNTER — Other Ambulatory Visit: Payer: Self-pay | Admitting: Family Medicine

## 2018-09-23 ENCOUNTER — Ambulatory Visit: Payer: BC Managed Care – PPO | Admitting: Family Medicine

## 2018-09-23 ENCOUNTER — Other Ambulatory Visit: Payer: Self-pay

## 2018-09-23 ENCOUNTER — Encounter: Payer: Self-pay | Admitting: Family Medicine

## 2018-09-23 VITALS — Wt 230.0 lb

## 2018-09-23 DIAGNOSIS — L509 Urticaria, unspecified: Secondary | ICD-10-CM | POA: Diagnosis not present

## 2018-09-23 MED ORDER — PREDNISONE 10 MG (21) PO TBPK: ORAL_TABLET | Freq: Every day | ORAL | 0 refills | Status: DC

## 2018-09-23 NOTE — Progress Notes (Signed)
   Subjective:   Documentation for virtual audio and video telecommunications through [facility] encounter:  The patient was located at home. 2 patient identifiers used.  The provider was located in the office. The patient did consent to this visit and is aware of possible charges through their insurance for this visit.  The other persons participating in this telemedicine service were none.     Patient ID: Megan [name], female    DOB: [birth year], 58 y.o.   MRN: [id number]  HPI Chief Complaint  Patient presents with  . hives    hives- 2 weeks, back, side and coming up shoulders. not all connected. itchy, red, raised.    She complains of a 2 week history of pruritic hives to her upper back, chest, and upper arms. States the hives seem to move around. No history of this prior.   States 3-4 weeks ago she had a pruritic rash on her back and it went away after a week.   Taking Benadryl at bedtime. Using hydrocortisone topically.   States she did change her laundry detergent but switched back to [employer].   Denies fever, chills, lip or tongue swelling, sore throat, cough, shortness of breath, cough, abdominal pain, N/V/D.   Reviewed allergies, medications, past medical, surgical, family, and social history.    Review of Systems Pertinent positives and negatives in the history of present illness.     Objective:   Physical Exam Wt 230 lb (104.3 kg)   BMI 33.97 kg/m   Alert and oriented and in no acute distress. Urticaria type rash to her chest, upper back and left upper arm. No sign of infection.       Assessment & Plan:  Urticaria of unknown origin - Plan: predniSONE (STERAPRED UNI-PAK 21 TAB) 10 MG (21) TBPK tablet  Will treat her with oral steroids and non drowsy antihistamine during the day. She may take Benadryl at bedtime. She will pay close attention to her symptoms. If she notices any oral involvement, she will call 911 or go to the ED. Follow up in 1 week.  Consider referral to allergist.   Time spent on call was 15 minutes and in review of previous records 2 minutes total.  This virtual service is not related to other E/M service within previous 7 days.

## 2018-10-07 ENCOUNTER — Telehealth: Payer: Self-pay | Admitting: Family Medicine

## 2018-10-07 DIAGNOSIS — L509 Urticaria, unspecified: Secondary | ICD-10-CM

## 2018-10-07 NOTE — Telephone Encounter (Signed)
PT left message on my voice mail that her HIVES are back for the 3rd time.  She wants a referral to allergist.. Pt ph [phone]

## 2018-10-07 NOTE — Telephone Encounter (Signed)
Left message for pt that referral was placed.

## 2018-10-07 NOTE — Telephone Encounter (Signed)
Ok to refer to allergist for urticaria. Please let her know. See if we can put in urgent referral.

## 2018-10-14 ENCOUNTER — Encounter: Payer: Self-pay | Admitting: Pediatrics

## 2018-10-14 ENCOUNTER — Ambulatory Visit: Payer: BC Managed Care – PPO | Admitting: Pediatrics

## 2018-10-14 ENCOUNTER — Other Ambulatory Visit: Payer: Self-pay

## 2018-10-14 VITALS — BP 112/72 | HR 84 | Temp 97.1°F | Resp 16 | Ht 69.0 in | Wt 239.2 lb

## 2018-10-14 DIAGNOSIS — J342 Deviated nasal septum: Secondary | ICD-10-CM

## 2018-10-14 DIAGNOSIS — L500 Allergic urticaria: Secondary | ICD-10-CM | POA: Diagnosis not present

## 2018-10-14 DIAGNOSIS — J3089 Other allergic rhinitis: Secondary | ICD-10-CM | POA: Diagnosis not present

## 2018-10-14 MED ORDER — AZELASTINE HCL 0.1 % NA SOLN: NASAL | 5 refills | Status: DC

## 2018-10-14 MED ORDER — TRIAMCINOLONE ACETONIDE 0.1 % EX CREA: TOPICAL_CREAM | CUTANEOUS | 3 refills | Status: DC

## 2018-10-14 MED ORDER — FLUTICASONE PROPIONATE 50 MCG/ACT NA SUSP: NASAL | 5 refills | Status: AC

## 2018-10-14 NOTE — Patient Instructions (Addendum)
Environmental control of dust and mold Zyrtec 10 mg-take 1 tablet once a day for itching or runny nose instead of Claritin Azelastine 0.1% - 2 sprays per nostril twice a day if needed for a sinus headache Fluticasone 1 spray per nostril twice a day if needed for stuffy nose Opcon-A-1 drop 3 times a day if needed for itchy eyes  Triamcinolone 0.1% cream twice a day if  needed to  red itchy areas below the face.  You may also use Benadryl cream 3 times a day in the areas of insect bites. If you have taken Zyrtec 10 mg in the morning, you may use Benadryl 25 mg every 4 hours for itching.  Use Dreft or Ivory detergent.  Do not use bleaches or softeners

## 2018-10-14 NOTE — Progress Notes (Addendum)
[place] [address] Dept: [phone]  New Patient Note  Patient ID: Megan [name], female    DOB: [birth year]  Age: 58 y.o. MRN: [id number] Date of Office Visit: 10/14/2018 Referring provider: Girtha Rm, NP-C [place],  [place] [address]    Chief Complaint: Urticaria (going on intermittently x's 2 months and when the hives are present they last about 2 weeks they go away.), Food Intolerance (chocolate and milk. pt. had oreos and milk one saturday night within 48 hours she broke out with hive. years m&m's seem to bring on sinus headaches), and Allergic Rhinitis  (fall bothers her. the ragweed.)  HPI Megan [name] presents for evaluation of an itchy rash for about 2 months.  The rash did improve once when she was on prednisone for a few days.  After she finishes the prednisone the rash comes back.  She may have had insect bites on her back when she had  her first reaction.  She rides horses once a week.  She is concerned that the rash got worse from milk and chocolate.  She ate a steak 48 hours before one episode but she can eat hamburger since then without any problem.  She did not have any swelling of her throat, swelling of her tongue or cardiorespiratory symptoms.  She has not had tick bites.  The initial rash lasted about 2 weeks and she used Benadryl.  She has very dry skin.  She has never been diagnosed with eczema  She has had sinus problems for several years.  Her symptoms are perennial.  She has aggravation of her symptoms on exposure to dust, cigarette smoke in the fall of the year.  She has had sinus headaches.  An ENT specialist told her that  she had small sinuses and a deviated nasal septum.. She has had one sinus infection in the past.  She has never had asthmatic symptoms or pneumonia.  Review of Systems  Constitutional: Negative.   HENT:       Sinus problems for several years with sinus headaches.  Deviated nasal septum  Eyes:       Itch at times  Respiratory: Negative.   Cardiovascular: Negative.   Gastrointestinal: Negative.   Genitourinary: Negative.   Musculoskeletal:       Removal of some fibrotic nodules from the bottom of her  Heels.  Surgery in her hands  Skin:       Hives for about 2 months without a clear-cut reason  Neurological: Negative.   Endo/Heme/Allergies:       Hypothyroidism.  No diabetes  Psychiatric/Behavioral: Negative.     Outpatient Encounter Medications as of 10/14/2018  Medication Sig  . IBUPROFEN PO Take by mouth as needed.  Marland Kitchen levothyroxine (SYNTHROID) 125 MCG tablet TAKE 1 TAB BY MOUTH ONCE DAILY. TAKE ON AN EMPTY STOMACH WITH A GLASSOF WATER ATLEAST 30-60 MINUTES BEFORE BREAKFAST  . Pseudoephedrine HCl (SUDAFED PO) Take by mouth.  . valACYclovir (VALTREX) 1000 MG tablet TAKE 2 TABLETS BY MOUTH EVERY 12 HOURS FOR 1 DAY (Patient taking differently: Take 1,000 mg by mouth as needed. TAKE 2 TABLETS BY MOUTH EVERY 12 HOURS FOR 1 DAY)  . azelastine (ASTELIN) 0.1 % nasal spray 2 sprays per nostril twice a day if needed for a sinus headache  . fluticasone (FLONASE) 50 MCG/ACT nasal spray 1 spray per nostril twice a day if needed for stuffy nose.  . triamcinolone cream (KENALOG) 0.1 % Apply twice a  day if needed to red itchy areas below the face.  . [DISCONTINUED] predniSONE (STERAPRED UNI-PAK 21 TAB) 10 MG (21) TBPK tablet Take by mouth daily.  . [DISCONTINUED] triamcinolone cream (KENALOG) 0.1 % triamcinolone acetonide 0.1 % topical cream   No facility-administered encounter medications on file as of 10/14/2018.      Drug Allergies:  No Known Allergies  Family History: Dorotea's family history includes Hypertension in her father; Hypothyroidism in her brother and mother..  Family history is positive for hayfever and sinus problems.  Family history is negative for asthma, angioedema, eczema, hives, food allergies, lupus.  Social and environmental.  They have 2 cats and a dog in the home.   Her husband smokes outside.  She used to smoke cigarettes averaging less than a pack a day for 15 years.  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  They make cleansers and antimicrobial cleansers.  Her symptoms are not worse at work.  Physical Exam: BP 112/72 (BP Location: Left Arm, Patient Position: Sitting, Cuff Size: Normal)   Pulse 84   Temp (!) 97.1 F (36.2 C) (Temporal)   Resp 16   Ht 5\' 9"  (1.753 m)   Wt 239 lb 3.2 oz (108.5 kg)   SpO2 97%   BMI 35.32 kg/m    Physical Exam Vitals signs reviewed.  Constitutional:      Appearance: Normal appearance. She is obese.  HENT:     Head:     Comments: Eyes normal.  Ears normal.  Nose mild swelling of the nasal turbinates with septal deviation to the left side.  Pharynx normal. Neck:     Musculoskeletal: Neck supple.     Comments: No thyromegaly Cardiovascular:     Rate and Rhythm: Normal rate and regular rhythm.     Comments: S1-S2 normal no murmurs Pulmonary:     Comments: Clear to percussion and  auscultation Abdominal:     Palpations: Abdomen is soft.     Tenderness: There is no abdominal tenderness.     Comments: No hepatosplenomegaly  Lymphadenopathy:     Cervical: No cervical adenopathy.  Neurological:     General: No focal deficit present.     Mental Status: She is alert and oriented to person, place, and time.  Psychiatric:        Mood and Affect: Mood normal.        Behavior: Behavior normal.        Thought Content: Thought content normal.        Judgment: Judgment normal.     Diagnostics: Allergy skin tests were positive to molds, cat and dog on intradermal testing only   Assessment  Assessment and Plan: 1. Allergic urticaria   2. Other allergic rhinitis   3. Deviated nasal septum     Meds ordered this encounter  Medications  . azelastine (ASTELIN) 0.1 % nasal spray    Sig: 2 sprays per nostril twice a day if needed for a sinus headache    Dispense:  30 mL    Refill:  5  . fluticasone (FLONASE)  50 MCG/ACT nasal spray    Sig: 1 spray per nostril twice a day if needed for stuffy nose.    Dispense:  16 g    Refill:  5  . triamcinolone cream (KENALOG) 0.1 %    Sig: Apply twice a day if needed to red itchy areas below the face.    Dispense:  45 g    Refill:  3  Patient Instructions  Environmental control of dust and mold Zyrtec 10 mg-take 1 tablet once a day for itching or runny nose instead of Claritin Azelastine 0.1% - 2 sprays per nostril twice a day if needed for a sinus headache Fluticasone 1 spray per nostril twice a day if needed for stuffy nose Opcon-A-1 drop 3 times a day if needed for itchy eyes  Triamcinolone 0.1% cream twice a day if  needed to  red itchy areas below the face.  You may also use Benadryl cream 3 times a day in the areas of insect bites. If you have taken Zyrtec 10 mg in the morning, you may use Benadryl 25 mg every 4 hours for itching.  Use Dreft or Ivory detergent.  Do not use bleaches or softeners   Return in about 4 weeks (around 11/11/2018).   Thank you for the opportunity to care for this patient.  Please do not hesitate to contact me with questions.  Penne Lash, M.D.  [facility] [address] [place], [place] [address] [phone]

## 2018-12-02 DIAGNOSIS — Z20828 Contact with and (suspected) exposure to other viral communicable diseases: Secondary | ICD-10-CM | POA: Diagnosis not present

## 2018-12-06 DIAGNOSIS — Z20828 Contact with and (suspected) exposure to other viral communicable diseases: Secondary | ICD-10-CM | POA: Diagnosis not present

## 2018-12-23 ENCOUNTER — Ambulatory Visit: Payer: BC Managed Care – PPO | Admitting: Family Medicine

## 2018-12-23 ENCOUNTER — Encounter: Payer: Self-pay | Admitting: Family Medicine

## 2018-12-23 ENCOUNTER — Other Ambulatory Visit: Payer: Self-pay

## 2018-12-23 VITALS — BP 110/70 | HR 71 | Temp 98.6°F | Ht 69.0 in | Wt 243.6 lb

## 2018-12-23 DIAGNOSIS — Z23 Encounter for immunization: Secondary | ICD-10-CM

## 2018-12-23 DIAGNOSIS — Z0000 Encounter for general adult medical examination without abnormal findings: Secondary | ICD-10-CM | POA: Diagnosis not present

## 2018-12-23 DIAGNOSIS — E669 Obesity, unspecified: Secondary | ICD-10-CM

## 2018-12-23 DIAGNOSIS — E039 Hypothyroidism, unspecified: Secondary | ICD-10-CM

## 2018-12-23 DIAGNOSIS — E7800 Pure hypercholesterolemia, unspecified: Secondary | ICD-10-CM

## 2018-12-23 HISTORY — DX: Obesity, unspecified: E66.9

## 2018-12-23 NOTE — Patient Instructions (Addendum)
Find ways to cut back on portion sizes or make healthy food choices.  Increase your physical activity as discussed. It is recommended that you get at least 150 minutes of physical activity per week.   You may want to check with your insurance carrier regarding the Shingrix vaccine. It is a 2 shot series.    Preventive Care 82-56 Years Old, Female Preventive care refers to visits with your health care provider and lifestyle choices that can promote health and wellness. This includes:  A yearly physical exam. This may also be called an annual well check.  Regular dental visits and eye exams.  Immunizations.  Screening for certain conditions.  Healthy lifestyle choices, such as eating a healthy diet, getting regular exercise, not using drugs or products that contain nicotine and tobacco, and limiting alcohol use. What can I expect for my preventive care visit? Physical exam Your health care provider will check your:  Height and weight. This may be used to calculate body mass index (BMI), which tells if you are at a healthy weight.  Heart rate and blood pressure.  Skin for abnormal spots. Counseling Your health care provider may ask you questions about your:  Alcohol, tobacco, and drug use.  Emotional well-being.  Home and relationship well-being.  Sexual activity.  Eating habits.  Work and work Statistician.  Method of birth control.  Menstrual cycle.  Pregnancy history. What immunizations do I need?  Influenza (flu) vaccine  This is recommended every year. Tetanus, diphtheria, and pertussis (Tdap) vaccine  You may need a Td booster every 10 years. Varicella (chickenpox) vaccine  You may need this if you have not been vaccinated. Zoster (shingles) vaccine  You may need this after age 1. Measles, mumps, and rubella (MMR) vaccine  You may need at least one dose of MMR if you were born in 1957 or later. You may also need a second dose. Pneumococcal conjugate  (PCV13) vaccine  You may need this if you have certain conditions and were not previously vaccinated. Pneumococcal polysaccharide (PPSV23) vaccine  You may need one or two doses if you smoke cigarettes or if you have certain conditions. Meningococcal conjugate (MenACWY) vaccine  You may need this if you have certain conditions. Hepatitis A vaccine  You may need this if you have certain conditions or if you travel or work in places where you may be exposed to hepatitis A. Hepatitis B vaccine  You may need this if you have certain conditions or if you travel or work in places where you may be exposed to hepatitis B. Haemophilus influenzae type b (Hib) vaccine  You may need this if you have certain conditions. Human papillomavirus (HPV) vaccine  If recommended by your health care provider, you may need three doses over 6 months. You may receive vaccines as individual doses or as more than one vaccine together in one shot (combination vaccines). Talk with your health care provider about the risks and benefits of combination vaccines. What tests do I need? Blood tests  Lipid and cholesterol levels. These may be checked every 5 years, or more frequently if you are over 33 years old.  Hepatitis C test.  Hepatitis B test. Screening  Lung cancer screening. You may have this screening every year starting at age 39 if you have a 30-pack-year history of smoking and currently smoke or have quit within the past 15 years.  Colorectal cancer screening. All adults should have this screening starting at age 80 and continuing until age  46. Your health care provider may recommend screening at age 68 if you are at increased risk. You will have tests every 1-10 years, depending on your results and the type of screening test.  Diabetes screening. This is done by checking your blood sugar (glucose) after you have not eaten for a while (fasting). You may have this done every 1-3 years.  Mammogram. This  may be done every 1-2 years. Talk with your health care provider about when you should start having regular mammograms. This may depend on whether you have a family history of breast cancer.  BRCA-related cancer screening. This may be done if you have a family history of breast, ovarian, tubal, or peritoneal cancers.  Pelvic exam and Pap test. This may be done every 3 years starting at age 53. Starting at age 25, this may be done every 5 years if you have a Pap test in combination with an HPV test. Other tests  Sexually transmitted disease (STD) testing.  Bone density scan. This is done to screen for osteoporosis. You may have this scan if you are at high risk for osteoporosis. Follow these instructions at home: Eating and drinking  Eat a diet that includes fresh fruits and vegetables, whole grains, lean protein, and low-fat dairy.  Take vitamin and mineral supplements as recommended by your health care provider.  Do not drink alcohol if: ? Your health care provider tells you not to drink. ? You are pregnant, may be pregnant, or are planning to become pregnant.  If you drink alcohol: ? Limit how much you have to 0-1 drink a day. ? Be aware of how much alcohol is in your drink. In the [place] one drink equals one 12 oz bottle of beer (355 mL), one 5 oz glass of wine (148 mL), or one 1 oz glass of hard liquor (44 mL). Lifestyle  Take daily care of your teeth and gums.  Stay active. Exercise for at least 30 minutes on 5 or more days each week.  Do not use any products that contain nicotine or tobacco, such as cigarettes, e-cigarettes, and chewing tobacco. If you need help quitting, ask your health care provider.  If you are sexually active, practice safe sex. Use a condom or other form of birth control (contraception) in order to prevent pregnancy and STIs (sexually transmitted infections).  If told by your health care provider, take low-dose aspirin daily starting at age 12. What's  next?  Visit your health care provider once a year for a well check visit.  Ask your health care provider how often you should have your eyes and teeth checked.  Stay up to date on all vaccines. This information is not intended to replace advice given to you by your health care provider. Make sure you discuss any questions you have with your health care provider. Document Released: 02/05/2015 Document Revised: 09/20/2017 Document Reviewed: 09/20/2017 Elsevier Patient Education  2020 [employer].

## 2018-12-23 NOTE — Progress Notes (Signed)
Subjective:    Patient ID: Megan [name], female    DOB: 13-Aug-1960, 58 y.o.   MRN: [id number]  HPI Chief Complaint  Patient presents with  . cpe    fasting cpe   She is here for a complete physical exam. Last CPE: 12/17/2017  Other providers: Dermatologist  Dentist  Eye doctor  [facility]?  Hypothyroidism has been taking the same dose of levothyroxine.  Needs TSH checked.  States she recently had allergy testing and that was found to be allergic to only mold.  She takes allergy medications as needed.  Social history: Lives with husband. Daughter, son in law and grandchild moved in. Had 3 adult children, works as English as a second language teacher.  Denies smoking, drinking alcohol, drug use.  She is a former smoker with a 10-pack-year history and stopped in approximately 2000 Denies smoking, drinking alcohol, drug use  Diet: very unhealthy.  She is aware that she has gained significant weight and states it is due to overeating and poor food choices. Excerise: walks slow pace   Immunizations: Tdap UTD.   Health maintenance:  Mammogram: 03/2018 Colonoscopy: 2016 Last Gynecological Exam: last pap smear in 2018 DEXA: 03/2018 normal  Last Menstrual cycle: 8-9 years ago  Last Dental Exam: UTD  Last Eye Exam: UTD   Wears seatbelt always,  smoke detectors in home and functioning, does not text while driving and feels safe in home environment.   Reviewed allergies, medications, past medical, surgical, family, and social history.     Review of Systems Review of Systems Constitutional: -fever, -chills, -sweats, -unexpected weight change,-fatigue ENT: -runny nose, -ear pain, -sore throat Cardiology:  -chest pain, -palpitations, -edema Respiratory: -cough, -shortness of breath, -wheezing Gastroenterology: -abdominal pain, -nausea, -vomiting, -diarrhea, -constipation  Hematology: -bleeding or bruising problems Musculoskeletal: -arthralgias, -myalgias, -joint swelling, -back pain Ophthalmology:  -vision changes Urology: -dysuria, -difficulty urinating, -hematuria, -urinary frequency, -urgency Neurology: -headache, -weakness, -tingling, -numbness       Objective:   Physical Exam BP 110/70   Pulse 71   Temp 98.6 F (37 C)   Ht 5\' 9"  (1.753 m)   Wt 243 lb 9.6 oz (110.5 kg)   BMI 35.97 kg/m   General Appearance:    Alert, cooperative, no distress, appears stated age  Head:    Normocephalic, without obvious abnormality, atraumatic  Eyes:    PERRL, conjunctiva/corneas clear, EOM's intact, fundi    benign  Ears:    Normal TM's and external ear canals  Nose:  Mask in place  Throat:  Mask in place  Neck:   Supple, no lymphadenopathy;  thyroid:  no   enlargement/tenderness/nodules; no carotid   bruit or JVD  Back:    Spine nontender, no curvature, ROM normal, no CVA     tenderness  Lungs:     Clear to auscultation bilaterally without wheezes, rales or     ronchi; respirations unlabored  Chest Wall:    No tenderness or deformity   Heart:    Regular rate and rhythm, S1 and S2 normal, no murmur, rub   or gallop  Breast Exam:    No tenderness, masses, or nipple discharge or inversion.      No axillary lymphadenopathy  Abdomen:     Soft, non-tender, nondistended, normoactive bowel sounds,    no masses, no hepatosplenomegaly  Genitalia:    Declines. Pap UTD      Extremities:   No clubbing, cyanosis or edema  Pulses:   2+ and symmetric all extremities  Skin:  Skin color, texture, turgor normal, no rashes or lesions  Lymph nodes:   Cervical, supraclavicular, and axillary nodes normal  Neurologic:   CNII-XII intact, normal strength, sensation and gait; reflexes 2+ and symmetric throughout          Psych:   Normal mood, affect, hygiene and grooming.        Assessment & Plan:  Routine general medical examination at a health care facility - Plan: CBC with Differential, Comprehensive metabolic panel, TSH, T4, Free, Lipid Panel -Here today for a fasting CPE.  She reports being in lul  over the past several months due to COVID-19 pandemic and limitations.  States she has been eating more than usual and her diet has been poor which has caused significant weight gain.  Counseling done on making healthier food choices and cutting back on portion sizes.  I also encouraged her to increase physical activity. Preventive healthcare discussed and she is up-to-date on mammogram, DEXA, colonoscopy and Pap smear.  Immunizations reviewed.  Discussed that she may consider getting the Shingrix vaccine.  She will check with her insurance and let me know.  Safety discussed  Hypothyroidism, unspecified type - Plan: TSH, T4, Free -She has been stable on current dose of levothyroxine.  Follow-up pending lab results  Elevated LDL cholesterol level - Plan: Lipid Panel -Counseling on healthy diet and exercise to help improve LDL.  Check lipid panel and follow-up  Obesity (BMI 30-39.9) -In-depth counseling on healthy diet and exercise for weight loss.  We also discussed long-term health consequences associated with obesity.  Needs flu shot - Plan: Flu Vaccine QUAD 36+ mos PF IM (Fluarix & Fluzone Quad PF) -Discussed potential side effects

## 2018-12-24 ENCOUNTER — Other Ambulatory Visit: Payer: Self-pay | Admitting: Family Medicine

## 2018-12-24 ENCOUNTER — Encounter: Payer: Self-pay | Admitting: Family Medicine

## 2018-12-24 DIAGNOSIS — R7301 Impaired fasting glucose: Secondary | ICD-10-CM

## 2018-12-24 DIAGNOSIS — E039 Hypothyroidism, unspecified: Secondary | ICD-10-CM

## 2018-12-24 HISTORY — DX: Impaired fasting glucose: R73.01

## 2018-12-24 LAB — T4, FREE: Free T4: 1.47 ng/dL (ref 0.82–1.77)

## 2018-12-24 LAB — COMPREHENSIVE METABOLIC PANEL
ALT: 21 IU/L (ref 0–32)
AST: 20 IU/L (ref 0–40)
Albumin/Globulin Ratio: 1.7 (ref 1.2–2.2)
Albumin: 4.5 g/dL (ref 3.8–4.9)
Alkaline Phosphatase: 86 IU/L (ref 39–117)
BUN/Creatinine Ratio: 16 (ref 9–23)
BUN: 14 mg/dL (ref 6–24)
Bilirubin Total: 0.4 mg/dL (ref 0.0–1.2)
CO2: 22 mmol/L (ref 20–29)
Calcium: 9.7 mg/dL (ref 8.7–10.2)
Chloride: 104 mmol/L (ref 96–106)
Creatinine, Ser: 0.9 mg/dL (ref 0.57–1.00)
GFR calc Af Amer: 83 mL/min/{1.73_m2} (ref >59)
GFR calc non Af Amer: 72 mL/min/{1.73_m2} (ref >59)
Globulin, Total: 2.7 g/dL (ref 1.5–4.5)
Glucose: 105 mg/dL — ABNORMAL HIGH (ref 65–99)
Potassium: 5 mmol/L (ref 3.5–5.2)
Sodium: 139 mmol/L (ref 134–144)
Total Protein: 7.2 g/dL (ref 6.0–8.5)

## 2018-12-24 LAB — LIPID PANEL
Chol/HDL Ratio: 3.6 ratio (ref 0.0–4.4)
Cholesterol, Total: 206 mg/dL — ABNORMAL HIGH (ref 100–199)
HDL: 58 mg/dL (ref >39)
LDL Chol Calc (NIH): 125 mg/dL — ABNORMAL HIGH (ref 0–99)
Triglycerides: 128 mg/dL (ref 0–149)
VLDL Cholesterol Cal: 23 mg/dL (ref 5–40)

## 2018-12-24 LAB — CBC WITH DIFFERENTIAL/PLATELET
Basophils Absolute: 0.1 10*3/uL (ref 0.0–0.2)
Basos: 1 %
EOS (ABSOLUTE): 0.1 10*3/uL (ref 0.0–0.4)
Eos: 2 %
Hematocrit: 45.6 % (ref 34.0–46.6)
Hemoglobin: 15.6 g/dL (ref 11.1–15.9)
Immature Grans (Abs): 0 10*3/uL (ref 0.0–0.1)
Immature Granulocytes: 0 %
Lymphocytes Absolute: 2 10*3/uL (ref 0.7–3.1)
Lymphs: 33 %
MCH: 31.1 pg (ref 26.6–33.0)
MCHC: 34.2 g/dL (ref 31.5–35.7)
MCV: 91 fL (ref 79–97)
Monocytes Absolute: 0.6 10*3/uL (ref 0.1–0.9)
Monocytes: 10 %
Neutrophils Absolute: 3.2 10*3/uL (ref 1.4–7.0)
Neutrophils: 54 %
Platelets: 304 10*3/uL (ref 150–450)
RBC: 5.01 x10E6/uL (ref 3.77–5.28)
RDW: 12.4 % (ref 11.7–15.4)
WBC: 6 10*3/uL (ref 3.4–10.8)

## 2018-12-24 LAB — TSH: TSH: 0.847 u[IU]/mL (ref 0.450–4.500)

## 2018-12-24 MED ORDER — LEVOTHYROXINE SODIUM 125 MCG PO TABS: ORAL_TABLET | ORAL | 5 refills | Status: DC

## 2019-02-15 ENCOUNTER — Other Ambulatory Visit: Payer: Self-pay | Admitting: Family Medicine

## 2019-02-15 DIAGNOSIS — B001 Herpesviral vesicular dermatitis: Secondary | ICD-10-CM

## 2019-02-17 NOTE — Telephone Encounter (Signed)
[facility] is requesting to fill pt valtrex. Please advise [facility]

## 2019-05-26 DIAGNOSIS — M25551 Pain in right hip: Secondary | ICD-10-CM | POA: Diagnosis not present

## 2019-05-26 DIAGNOSIS — M545 Low back pain: Secondary | ICD-10-CM | POA: Diagnosis not present

## 2019-06-30 ENCOUNTER — Other Ambulatory Visit: Payer: Self-pay | Admitting: Family Medicine

## 2019-06-30 DIAGNOSIS — E039 Hypothyroidism, unspecified: Secondary | ICD-10-CM

## 2019-08-28 ENCOUNTER — Other Ambulatory Visit: Payer: Self-pay | Admitting: Family Medicine

## 2019-08-28 DIAGNOSIS — E039 Hypothyroidism, unspecified: Secondary | ICD-10-CM

## 2019-09-03 DIAGNOSIS — L821 Other seborrheic keratosis: Secondary | ICD-10-CM | POA: Diagnosis not present

## 2019-09-03 DIAGNOSIS — D2371 Other benign neoplasm of skin of right lower limb, including hip: Secondary | ICD-10-CM | POA: Diagnosis not present

## 2019-09-03 DIAGNOSIS — L578 Other skin changes due to chronic exposure to nonionizing radiation: Secondary | ICD-10-CM | POA: Diagnosis not present

## 2019-09-03 DIAGNOSIS — D2372 Other benign neoplasm of skin of left lower limb, including hip: Secondary | ICD-10-CM | POA: Diagnosis not present

## 2019-10-22 ENCOUNTER — Encounter: Payer: Self-pay | Admitting: Family Medicine

## 2019-10-22 ENCOUNTER — Other Ambulatory Visit: Payer: Self-pay

## 2019-10-22 ENCOUNTER — Ambulatory Visit (INDEPENDENT_AMBULATORY_CARE_PROVIDER_SITE_OTHER): Payer: BC Managed Care – PPO | Admitting: Family Medicine

## 2019-10-22 VITALS — BP 130/80 | HR 72 | Temp 98.0°F | Ht 69.0 in | Wt 241.6 lb

## 2019-10-22 DIAGNOSIS — R300 Dysuria: Secondary | ICD-10-CM

## 2019-10-22 DIAGNOSIS — N3001 Acute cystitis with hematuria: Secondary | ICD-10-CM

## 2019-10-22 DIAGNOSIS — Z23 Encounter for immunization: Secondary | ICD-10-CM | POA: Diagnosis not present

## 2019-10-22 LAB — POCT URINALYSIS DIP (PROADVANTAGE DEVICE)
Bilirubin, UA: NEGATIVE
Glucose, UA: NEGATIVE mg/dL
Ketones, POC UA: NEGATIVE mg/dL
Nitrite, UA: NEGATIVE
Protein Ur, POC: 30 mg/dL — AB
Specific Gravity, Urine: 1.03
Urobilinogen, Ur: NEGATIVE
pH, UA: 5.5 (ref 5.0–8.0)

## 2019-10-22 MED ORDER — NITROFURANTOIN MONOHYD MACRO 100 MG PO CAPS: 100.0000 mg | ORAL_CAPSULE | Freq: Two times a day (BID) | ORAL | 0 refills | Status: DC

## 2019-10-22 NOTE — Progress Notes (Signed)
Chief Complaint  Patient presents with  . Dysuria    started yesterday with some discomfort when urinating. Now is burning. Feels like her bladder is full and then she will go and she does not go very much.   . Immunizations    was thinking about getting her shingrix and flu shot today, your thoughts?   Yesterday afternoon she started having pain with urination, pain as she was voiding and in the bladder area.  She drank a lot of water and cranberry juice (and wine). She felt better over night, this morning wasn't as bad as yesterday evening.  Symptoms are progressively getting worse throughout the day. +urgency/frequency. She has noticed some blood in the urine.  No prior h/o UTI's (distant)  She is asking about getting flu shot and shingrix today.  PMH, PSH, SH reviewed  Immunization History  Administered Date(s) Administered  . Influenza,inj,Quad PF,6+ Mos 11/05/2015, 11/09/2016, 12/26/2017, 12/23/2018  . Tdap 11/05/2015, 11/09/2016  Has had COVID vaccines (doesn't have dates with her).  Outpatient Encounter Medications as of 10/22/2019  Medication Sig  . levothyroxine (SYNTHROID) 125 MCG tablet TAKE 1 TAB BY MOUTH ONCE DAILY. TAKE ON AN EMPTY STOMACH WITH A GLASS OF WATER ATLEAST 30-60 MINUTES BEFORE BREAKFAST  . azelastine (ASTELIN) 0.1 % nasal spray 2 sprays per nostril twice a day if needed for a sinus headache (Patient not taking: Reported on 10/22/2019)  . fluticasone (FLONASE) 50 MCG/ACT nasal spray 1 spray per nostril twice a day if needed for stuffy nose. (Patient not taking: Reported on 10/22/2019)  . IBUPROFEN PO Take by mouth as needed. (Patient not taking: Reported on 10/22/2019)  . Pseudoephedrine HCl (SUDAFED PO) Take by mouth. (Patient not taking: Reported on 10/22/2019)  . triamcinolone cream (KENALOG) 0.1 % Apply twice a day if needed to red itchy areas below the face. (Patient not taking: Reported on 10/22/2019)  . valACYclovir (VALTREX) 1000 MG tablet Take 1 tablet  (1,000 mg total) by mouth as needed. TAKE 2 TABLETS BY MOUTH EVERY 12 HOURS FOR 1 DAY (Patient not taking: Reported on 10/22/2019)   No facility-administered encounter medications on file as of 10/22/2019.   No Known Allergies  ROS: no fever, chills, nausea, vomiting, diarrhea, vaginal discharge, URI symptoms.  +urinary complaints per HPI.   PHYSICAL EXAM:  BP 130/80   Pulse 72   Temp 98 F (36.7 C) (Tympanic)   Ht 5\' 9"  (1.753 m)   Wt 241 lb 9.6 oz (109.6 kg)   BMI 35.68 kg/m   Well appearing, pleasant female in no distress HEENT: conjunctiva and sclera are clear, wearing mask Heart: regular rate and rhythm Lungs: clear bilaterally Back: no CVA tenderness Abdomen: soft, +suprapubic tenderness. No rebound/guarding Extremities: no edema Psych: normal mood, affect, hygiene and grooming Neuro: alert and oriented, normal strength, gait  Urine dip: cloudy, large blood.  SG 1.030, small leuks, 30 protein  ASSESSMENT/PLAN:  Acute cystitis with hematuria - treat with macrobid, increase hydration. Azo short-term if needed. To contact us if sx not improving/worsening - Plan: Urine Culture, nitrofurantoin, macrocrystal-monohydrate, (MACROBID) 100 MG capsule  Burning with urination - Plan: POCT Urinalysis DIP (Proadvantage Device)  Need for shingles vaccine - risks and SE reviewed. Will get her 2nd shot when she comes for her physical in 2 months - Plan: Varicella-zoster vaccine IM (Shingrix)  Need for influenza vaccination - Plan: Flu Vaccine QUAD 6+ mos PF IM (Fluarix Quad PF)  Counseled in detail re: vaccines. Counseled re: s/sx worsening infection/pyelo, risks/SE  of medications. Counseled re: timing re: culture and sensitivity results. Discussed Azo, and short-term use only. Discussed hydration and cranberry extract/juice.

## 2019-10-22 NOTE — Patient Instructions (Addendum)
Drink plenty of water. You may use Azo (for bladder infections, available over-the-counter) if needed to help with discomfort while waiting for the antibiotics to kick in. Do not use for longer than 2-3 days.  Take the antibiotics twice daily for 5 days. You should notice improvement after 24-48 hours. Contact us if you develop fever, vomiting, allergic reaction to the medication, flank pain.

## 2019-10-25 LAB — URINE CULTURE

## 2019-12-23 NOTE — Patient Instructions (Signed)
Preventive Care 40-57 Years Old, Female °Preventive care refers to visits with your health care provider and lifestyle choices that can promote health and wellness. This includes: °· A yearly physical exam. This may also be called an annual well check. °· Regular dental visits and eye exams. °· Immunizations. °· Screening for certain conditions. °· Healthy lifestyle choices, such as eating a healthy diet, getting regular exercise, not using drugs or products that contain nicotine and tobacco, and limiting alcohol use. °What can I expect for my preventive care visit? °Physical exam °Your health care provider will check your: °· Height and weight. This may be used to calculate body mass index (BMI), which tells if you are at a healthy weight. °· Heart rate and blood pressure. °· Skin for abnormal spots. °Counseling °Your health care provider may ask you questions about your: °· Alcohol, tobacco, and drug use. °· Emotional well-being. °· Home and relationship well-being. °· Sexual activity. °· Eating habits. °· Work and work environment. °· Method of birth control. °· Menstrual cycle. °· Pregnancy history. °What immunizations do I need? ° °Influenza (flu) vaccine °· This is recommended every year. °Tetanus, diphtheria, and pertussis (Tdap) vaccine °· You may need a Td booster every 10 years. °Varicella (chickenpox) vaccine °· You may need this if you have not been vaccinated. °Zoster (shingles) vaccine °· You may need this after age 60. °Measles, mumps, and rubella (MMR) vaccine °· You may need at least one dose of MMR if you were born in 1957 or later. You may also need a second dose. °Pneumococcal conjugate (PCV13) vaccine °· You may need this if you have certain conditions and were not previously vaccinated. °Pneumococcal polysaccharide (PPSV23) vaccine °· You may need one or two doses if you smoke cigarettes or if you have certain conditions. °Meningococcal conjugate (MenACWY) vaccine °· You may need this if you  have certain conditions. °Hepatitis A vaccine °· You may need this if you have certain conditions or if you travel or work in places where you may be exposed to hepatitis A. °Hepatitis B vaccine °· You may need this if you have certain conditions or if you travel or work in places where you may be exposed to hepatitis B. °Haemophilus influenzae type b (Hib) vaccine °· You may need this if you have certain conditions. °Human papillomavirus (HPV) vaccine °· If recommended by your health care provider, you may need three doses over 6 months. °You may receive vaccines as individual doses or as more than one vaccine together in one shot (combination vaccines). Talk with your health care provider about the risks and benefits of combination vaccines. °What tests do I need? °Blood tests °· Lipid and cholesterol levels. These may be checked every 5 years, or more frequently if you are over 50 years old. °· Hepatitis C test. °· Hepatitis B test. °Screening °· Lung cancer screening. You may have this screening every year starting at age 55 if you have a 30-pack-year history of smoking and currently smoke or have quit within the past 15 years. °· Colorectal cancer screening. All adults should have this screening starting at age 50 and continuing until age 75. Your health care provider may recommend screening at age 45 if you are at increased risk. You will have tests every 1-10 years, depending on your results and the type of screening test. °· Diabetes screening. This is done by checking your blood sugar (glucose) after you have not eaten for a while (fasting). You may have this   done every 1-3 years.  Mammogram. This may be done every 1-2 years. Talk with your health care provider about when you should start having regular mammograms. This may depend on whether you have a family history of breast cancer.  BRCA-related cancer screening. This may be done if you have a family history of breast, ovarian, tubal, or peritoneal  cancers.  Pelvic exam and Pap test. This may be done every 3 years starting at age 76. Starting at age 89, this may be done every 5 years if you have a Pap test in combination with an HPV test. Other tests  Sexually transmitted disease (STD) testing.  Bone density scan. This is done to screen for osteoporosis. You may have this scan if you are at high risk for osteoporosis. Follow these instructions at home: Eating and drinking  Eat a diet that includes fresh fruits and vegetables, whole grains, lean protein, and low-fat dairy.  Take vitamin and mineral supplements as recommended by your health care provider.  Do not drink alcohol if: ? Your health care provider tells you not to drink. ? You are pregnant, may be pregnant, or are planning to become pregnant.  If you drink alcohol: ? Limit how much you have to 0-1 drink a day. ? Be aware of how much alcohol is in your drink. In the [place] one drink equals one 12 oz bottle of beer (355 mL), one 5 oz glass of wine (148 mL), or one 1 oz glass of hard liquor (44 mL). Lifestyle  Take daily care of your teeth and gums.  Stay active. Exercise for at least 30 minutes on 5 or more days each week.  Do not use any products that contain nicotine or tobacco, such as cigarettes, e-cigarettes, and chewing tobacco. If you need help quitting, ask your health care provider.  If you are sexually active, practice safe sex. Use a condom or other form of birth control (contraception) in order to prevent pregnancy and STIs (sexually transmitted infections).  If told by your health care provider, take low-dose aspirin daily starting at age 37. What's next?  Visit your health care provider once a year for a well check visit.  Ask your health care provider how often you should have your eyes and teeth checked.  Stay up to date on all vaccines. This information is not intended to replace advice given to you by your health care provider. Make sure you  discuss any questions you have with your health care provider. Document Revised: 09/20/2017 Document Reviewed: 09/20/2017 Elsevier Patient Education  2020 [employer].

## 2019-12-23 NOTE — Progress Notes (Signed)
Subjective:    Patient ID: Megan [name], female    DOB: [birth year], 59 y.o.   MRN: [id number]  HPI Chief Complaint  Patient presents with   cpe    fasting cpe, last pap 2018   She is here for a complete physical exam.  Other providers: Allergist  Dermatologist this year   Cold sores twice per year. Takes valacyclovir as needed  Allergies worse this time of year.   Vaginal dryness   Her mother passed recently and she has been having a hard time dealing with this.  Hypothyroidism-taking her medication as directed on empty stomach and no concerns  Social history: Lives with her husband, works as a English as a second language teacher  Denies smoking or drug use. Alcohol use social  Diet: fairly healthy and well balanced.  Excerise: 3 days 30 minutes   Immunizations: Covid vaccine in April, Pfizer  Flu and [employer]   Health maintenance:  Mammogram: 03/2018 Colonoscopy: 2016 and 10 yr recall  Last Gynecological Exam: 2 years ago   Last Dental Exam: twice yearly  Last Eye Exam: yearly   Wears seatbelt always, uses sunscreen, smoke detectors in home and functioning, does not text while driving and feels safe in home environment.   Reviewed allergies, medications, past medical, surgical, family, and social history.   Review of Systems Review of Systems Constitutional: -fever, -chills, -sweats, -unexpected weight change,-fatigue ENT: -runny nose, -ear pain, -sore throat Cardiology:  -chest pain, -palpitations, -edema Respiratory: -cough, -shortness of breath, -wheezing Gastroenterology: -abdominal pain, -nausea, -vomiting, -diarrhea, -constipation  Hematology: -bleeding or bruising problems Musculoskeletal: -arthralgias, -myalgias, -joint swelling, -back pain Ophthalmology: -vision changes Urology: -dysuria, -difficulty urinating, -hematuria, -urinary frequency, -urgency Neurology: -headache, -weakness, -tingling, -numbness       Objective:   Physical Exam BP 124/80    Pulse 90    Ht  5' 8.75" (1.746 m)    Wt 236 lb 3.2 oz (107.1 kg)    BMI 35.13 kg/m   General Appearance:    Alert, cooperative, no distress, appears stated age  Head:    Normocephalic, without obvious abnormality, atraumatic  Eyes:    PERRL, conjunctiva/corneas clear, EOM's intact  Ears:    Normal TM's and external ear canals  Nose:  Mask on  Throat:  Mask on  Neck:   Supple, no lymphadenopathy;  thyroid:  no   enlargement/tenderness/nodules; no JVD  Back:    Spine nontender, no curvature, ROM normal, no CVA     tenderness  Lungs:     Clear to auscultation bilaterally without wheezes, rales or     ronchi; respirations unlabored  Chest Wall:    No tenderness or deformity   Heart:    Regular rate and rhythm, S1 and S2 normal, no murmur, rub   or gallop  Breast Exam:   Declines  Abdomen:     Soft, non-tender, nondistended, normoactive bowel sounds,    no masses, no hepatosplenomegaly  Genitalia:   Declines     Extremities:   No clubbing, cyanosis or edema  Pulses:   2+ and symmetric all extremities  Skin:   Skin color, texture, turgor normal, no rashes or lesions  Lymph nodes:   Cervical, supraclavicular, and axillary nodes normal  Neurologic:   CNII-XII intact, normal strength, sensation and gait; reflexes 2+ and symmetric throughout          Psych:   Normal mood, affect, hygiene and grooming.        Assessment & Plan:  Routine general medical  examination at a health care facility - Plan: CBC with Differential/Platelet, Comprehensive metabolic panel, POCT Urinalysis DIP (Proadvantage Device), Lipid panel -Preventive health care reviewed and updated.  Counseled on healthy lifestyle including diet and exercise.  Immunizations reviewed.  Recommend regular dental and eye exams.  She also sees her dermatologist annually.  Discussed safety and motion.  Hypothyroidism, unspecified type - Plan: TSH, T4, free -She appears to be taking her medication as directed.  Check thyroid function and  follow-up  Elevated LDL cholesterol level - Plan: Lipid panel -Recommend low-fat, low-cholesterol diet and increasing physical activity.  Obesity (BMI 30-39.9) - Plan: Lipid panel, POCT glycosylated hemoglobin (Hb A1C) -Recommend low-carb diet and increasing physical activity.  Her fasting blood sugar was slightly elevated at her last visit.  I will screen her for diabetes.  Need for shingles vaccine - Plan: Varicella-zoster vaccine IM -Discussed potential side effects of this vaccine.  Screening for heart disease - Plan: EKG 12-Lead -She is asymptomatic EKG shows NSR with rate 72. No acute changes. Read by myself and Dr. Redmond School.   Impaired fasting glucose - Plan: POCT glycosylated hemoglobin (Hb A1C) -Check hemoglobin A1c to screen for prediabetes or diabetes.  Recommend low-carb diet and increasing physical activity.  Follow-up pending results

## 2019-12-24 ENCOUNTER — Ambulatory Visit (INDEPENDENT_AMBULATORY_CARE_PROVIDER_SITE_OTHER): Payer: BC Managed Care – PPO | Admitting: Family Medicine

## 2019-12-24 ENCOUNTER — Other Ambulatory Visit: Payer: Self-pay

## 2019-12-24 ENCOUNTER — Encounter: Payer: Self-pay | Admitting: Family Medicine

## 2019-12-24 VITALS — BP 124/80 | HR 90 | Ht 68.75 in | Wt 236.2 lb

## 2019-12-24 DIAGNOSIS — E7800 Pure hypercholesterolemia, unspecified: Secondary | ICD-10-CM | POA: Diagnosis not present

## 2019-12-24 DIAGNOSIS — E669 Obesity, unspecified: Secondary | ICD-10-CM

## 2019-12-24 DIAGNOSIS — Z136 Encounter for screening for cardiovascular disorders: Secondary | ICD-10-CM

## 2019-12-24 DIAGNOSIS — E039 Hypothyroidism, unspecified: Secondary | ICD-10-CM | POA: Diagnosis not present

## 2019-12-24 DIAGNOSIS — Z0000 Encounter for general adult medical examination without abnormal findings: Secondary | ICD-10-CM

## 2019-12-24 DIAGNOSIS — Z23 Encounter for immunization: Secondary | ICD-10-CM

## 2019-12-24 DIAGNOSIS — R7301 Impaired fasting glucose: Secondary | ICD-10-CM

## 2019-12-24 LAB — POCT URINALYSIS DIP (PROADVANTAGE DEVICE)
Bilirubin, UA: NEGATIVE
Blood, UA: NEGATIVE
Glucose, UA: NEGATIVE mg/dL
Ketones, POC UA: NEGATIVE mg/dL
Leukocytes, UA: NEGATIVE
Nitrite, UA: NEGATIVE
Protein Ur, POC: NEGATIVE mg/dL
Specific Gravity, Urine: 1.02
Urobilinogen, Ur: NEGATIVE
pH, UA: 6 (ref 5.0–8.0)

## 2019-12-25 LAB — LIPID PANEL
Chol/HDL Ratio: 4.2 ratio (ref 0.0–4.4)
Cholesterol, Total: 237 mg/dL — ABNORMAL HIGH (ref 100–199)
HDL: 56 mg/dL (ref >39)
LDL Chol Calc (NIH): 161 mg/dL — ABNORMAL HIGH (ref 0–99)
Triglycerides: 110 mg/dL (ref 0–149)
VLDL Cholesterol Cal: 20 mg/dL (ref 5–40)

## 2019-12-25 LAB — TSH: TSH: 1.63 u[IU]/mL (ref 0.450–4.500)

## 2019-12-25 LAB — COMPREHENSIVE METABOLIC PANEL
ALT: 19 IU/L (ref 0–32)
AST: 21 IU/L (ref 0–40)
Albumin/Globulin Ratio: 1.9 (ref 1.2–2.2)
Albumin: 4.8 g/dL (ref 3.8–4.9)
Alkaline Phosphatase: 81 IU/L (ref 44–121)
BUN/Creatinine Ratio: 14 (ref 9–23)
BUN: 14 mg/dL (ref 6–24)
Bilirubin Total: 0.4 mg/dL (ref 0.0–1.2)
CO2: 24 mmol/L (ref 20–29)
Calcium: 9.7 mg/dL (ref 8.7–10.2)
Chloride: 104 mmol/L (ref 96–106)
Creatinine, Ser: 0.97 mg/dL (ref 0.57–1.00)
GFR calc Af Amer: 75 mL/min/{1.73_m2} (ref >59)
GFR calc non Af Amer: 65 mL/min/{1.73_m2} (ref >59)
Globulin, Total: 2.5 g/dL (ref 1.5–4.5)
Glucose: 106 mg/dL — ABNORMAL HIGH (ref 65–99)
Potassium: 5.1 mmol/L (ref 3.5–5.2)
Sodium: 140 mmol/L (ref 134–144)
Total Protein: 7.3 g/dL (ref 6.0–8.5)

## 2019-12-25 LAB — CBC WITH DIFFERENTIAL/PLATELET
Basophils Absolute: 0.1 10*3/uL (ref 0.0–0.2)
Basos: 1 %
EOS (ABSOLUTE): 0.1 10*3/uL (ref 0.0–0.4)
Eos: 2 %
Hematocrit: 45.3 % (ref 34.0–46.6)
Hemoglobin: 15.9 g/dL (ref 11.1–15.9)
Immature Grans (Abs): 0 10*3/uL (ref 0.0–0.1)
Immature Granulocytes: 0 %
Lymphocytes Absolute: 1.6 10*3/uL (ref 0.7–3.1)
Lymphs: 36 %
MCH: 32.1 pg (ref 26.6–33.0)
MCHC: 35.1 g/dL (ref 31.5–35.7)
MCV: 92 fL (ref 79–97)
Monocytes Absolute: 0.5 10*3/uL (ref 0.1–0.9)
Monocytes: 10 %
Neutrophils Absolute: 2.2 10*3/uL (ref 1.4–7.0)
Neutrophils: 51 %
Platelets: 289 10*3/uL (ref 150–450)
RBC: 4.95 x10E6/uL (ref 3.77–5.28)
RDW: 11.9 % (ref 11.7–15.4)
WBC: 4.4 10*3/uL (ref 3.4–10.8)

## 2019-12-25 LAB — T4, FREE: Free T4: 1.55 ng/dL (ref 0.82–1.77)

## 2019-12-26 ENCOUNTER — Ambulatory Visit: Payer: BC Managed Care – PPO | Admitting: Family Medicine

## 2019-12-26 ENCOUNTER — Other Ambulatory Visit: Payer: Self-pay

## 2019-12-26 ENCOUNTER — Telehealth: Payer: Self-pay | Admitting: Internal Medicine

## 2019-12-26 VITALS — BP 130/80 | HR 84 | Temp 98.1°F

## 2019-12-26 DIAGNOSIS — L0390 Cellulitis, unspecified: Secondary | ICD-10-CM | POA: Diagnosis not present

## 2019-12-26 LAB — HGB A1C W/O EAG: Hgb A1c MFr Bld: 5.9 % — ABNORMAL HIGH (ref 4.8–5.6)

## 2019-12-26 LAB — SPECIMEN STATUS REPORT

## 2019-12-26 MED ORDER — CEPHALEXIN 500 MG PO CAPS: 500.0000 mg | ORAL_CAPSULE | Freq: Two times a day (BID) | ORAL | 0 refills | Status: DC

## 2019-12-26 NOTE — Telephone Encounter (Signed)
Pt got shingles shot the other day and now having swelling, tender and red 4in in diameter and then red line coming off rash.   Per jcl- infection could happen and needs to be seen  Pt will be worked in to schedule

## 2019-12-26 NOTE — Progress Notes (Signed)
   Subjective:    Patient ID: Megan [name], female    DOB: 1960-12-15, 59 y.o.   MRN: [id number]  HPI She got a shingles shot in her right arm on Wednesday and the next day developed redness and swelling in the area of the shot.  She then noted a streaking sensation anteriorly and is here for evaluation of that.   Review of Systems     Objective:   Physical Exam Alert and in no distress.  Exam of the right arm does show a 5 to 6 cm erythematous warm and tender area where she got the shot with some streaking anteriorly.       Assessment & Plan:  Cellulitis at injection site - Plan: cephALEXin (KEFLEX) 500 MG capsule I took a photo with her cell phone and did mark the area with a pen.  Explained that it could take over 24 hours for the antibiotic to kick in.  She will keep [place] informed.

## 2019-12-30 ENCOUNTER — Other Ambulatory Visit: Payer: Self-pay | Admitting: Family Medicine

## 2019-12-30 DIAGNOSIS — E039 Hypothyroidism, unspecified: Secondary | ICD-10-CM

## 2020-01-02 NOTE — Progress Notes (Signed)
Please call and let her know about her results. She has not checked her mychart and I recall her saying she had issues with it.

## 2020-01-27 ENCOUNTER — Telehealth: Payer: BC Managed Care – PPO | Admitting: Medical

## 2020-01-27 ENCOUNTER — Encounter: Payer: Self-pay | Admitting: Medical

## 2020-01-27 VITALS — Ht 69.0 in | Wt 235.0 lb

## 2020-01-27 DIAGNOSIS — H9209 Otalgia, unspecified ear: Secondary | ICD-10-CM

## 2020-01-27 DIAGNOSIS — J0110 Acute frontal sinusitis, unspecified: Secondary | ICD-10-CM

## 2020-01-27 DIAGNOSIS — J029 Acute pharyngitis, unspecified: Secondary | ICD-10-CM | POA: Diagnosis not present

## 2020-01-27 MED ORDER — AMOXICILLIN 875 MG PO TABS: 875.0000 mg | ORAL_TABLET | Freq: Two times a day (BID) | ORAL | 0 refills | Status: DC

## 2020-01-27 NOTE — Progress Notes (Signed)
Subjective:     Patient ID: Megan [name], female   DOB: [birth year], 60 y.o.   MRN: [id number]  This visit type was conducted due to national recommendations for restrictions regarding the COVID-19 Pandemic (e.g. social distancing) in an effort to limit this patient's exposure and mitigate transmission in our community.  Due to their co-morbid illnesses, this patient is at least at moderate risk for complications without adequate follow up.  This format is felt to be most appropriate for this patient at this time.    Documentation for virtual audio and video telecommunications through [place] encounter:  The patient was located at home. The provider was located in the office. The patient did consent to this visit and is aware of possible charges through their insurance for this visit.  The other persons participating in this telemedicine service were none. Time spent on call was 20 minutes and in review of previous records 20 minutes total.  This virtual service is not related to other E/M service within previous 7 days.   HPI Chief Complaint  Patient presents with  . Sinusitis    Started last Wednesday, sore throat and congestion with fatigue and left ear pain. Also had diarrhea. Negative PCR this morning    Virtual consult for illness.  Started 6 days ago.  Started with bad sore throat, and since then has has sinus pressure, congestion, some diarrhea, fatigue, sleeping a lot.  Had negative PCR covid test.  Today throat not as bad but left ear starting to hurt today.  Worried about sinus/ear infection.  Had the PCR swab 3 days into symptoms.    Having some cough at night with drainage.   No body aches or chills, no fever.  No nausea or vomiting.  Had several loose stools last week but that has calmed down some.    Drinking a lot of fluids, is careful with this.  Lots of water.  Using some sudafed or day/night cold medication.  Using her regular allergy spray. No loss or smell or  taste.  No covid contacts.  Has had the covid vaccine, but not booster yet.    Was on keflex a few weeks ago for skin infection related to shingrix vaccine.   Past Medical History:  Diagnosis Date  . Generalized headaches    seasonal  . Hypothyroidism   . Impaired fasting glucose 12/24/2018  . Seasonal allergies    Current Outpatient Medications on File Prior to Visit  Medication Sig Dispense Refill  . fluticasone (FLONASE) 50 MCG/ACT nasal spray 1 spray per nostril twice a day if needed for stuffy nose. 16 g 5  . levothyroxine (SYNTHROID) 125 MCG tablet TAKE 1 TAB BY MOUTH ONCE DAILY. TAKE ON AN EMPTY STOMACH WITH A GLASS OF WATER ATLEAST 30-60 MINUTES BEFORE BREAKFAST 30 tablet 5  . Pseudoephedrine HCl (SUDAFED PO) Take by mouth.     Marland Kitchen azelastine (ASTELIN) 0.1 % nasal spray 2 sprays per nostril twice a day if needed for a sinus headache (Patient not taking: Reported on 01/27/2020) 30 mL 5  . IBUPROFEN PO Take by mouth as needed.  (Patient not taking: Reported on 01/27/2020)    . triamcinolone cream (KENALOG) 0.1 % Apply twice a day if needed to red itchy areas below the face. (Patient not taking: Reported on 01/27/2020) 45 g 3  . valACYclovir (VALTREX) 1000 MG tablet Take 1 tablet (1,000 mg total) by mouth as needed. TAKE 2 TABLETS BY MOUTH EVERY 12 HOURS FOR 1 DAY (  Patient not taking: Reported on 01/27/2020) 30 tablet 2   No current facility-administered medications on file prior to visit.     Review of Systems As in subjective    Objective:   Physical Exam Due to coronavirus pandemic stay at home measures, patient visit was virtual and they were not examined in person.   Ht 5\' 9"  (1.753 m)   Wt 235 lb (106.6 kg)   BMI 34.70 kg/m   General: Well-developed well-nourished no acute distress Sounds stopped up in the head No obvious shortness of breath or wheezing    Assessment:     Encounter Diagnoses  Name Primary?  . Acute non-recurrent frontal sinusitis Yes  . Sore throat    . Otalgia, unspecified laterality        Plan:     She is almost a week into her symptoms that are worsening with the sinuses and ear with a negative Covid PCR test.  We will treat for sinus and ear infection.  Begin amoxicillin, rest, hydrate, continue day/night cold medication counter, add nasal saline flush and salt water gargles.  If not much improved within the next 3 to 4 days, then call back  Megan [name] was seen today for sinusitis.  Diagnoses and all orders for this visit:  Acute non-recurrent frontal sinusitis  Sore throat  Otalgia, unspecified laterality  f/u prn

## 2020-02-19 ENCOUNTER — Other Ambulatory Visit: Payer: Self-pay | Admitting: Family Medicine

## 2020-02-19 DIAGNOSIS — Z1231 Encounter for screening mammogram for malignant neoplasm of breast: Secondary | ICD-10-CM

## 2020-04-05 ENCOUNTER — Other Ambulatory Visit: Payer: Self-pay

## 2020-04-05 ENCOUNTER — Ambulatory Visit
Admission: RE | Admit: 2020-04-05 | Discharge: 2020-04-05 | Disposition: A | Discharge status: Home / Self Care | Payer: BC Managed Care – PPO | Source: Ambulatory Visit | Attending: Family Medicine | Admitting: Family Medicine

## 2020-04-05 DIAGNOSIS — Z1231 Encounter for screening mammogram for malignant neoplasm of breast: Secondary | ICD-10-CM

## 2020-04-30 ENCOUNTER — Encounter: Payer: Self-pay | Admitting: Family Medicine

## 2020-04-30 ENCOUNTER — Telehealth (INDEPENDENT_AMBULATORY_CARE_PROVIDER_SITE_OTHER): Payer: BC Managed Care – PPO | Admitting: Family Medicine

## 2020-04-30 ENCOUNTER — Other Ambulatory Visit: Payer: Self-pay

## 2020-04-30 VITALS — Wt 232.0 lb

## 2020-04-30 DIAGNOSIS — J0140 Acute pansinusitis, unspecified: Secondary | ICD-10-CM

## 2020-04-30 DIAGNOSIS — R059 Cough, unspecified: Secondary | ICD-10-CM

## 2020-04-30 MED ORDER — AMOXICILLIN-POT CLAVULANATE 875-125 MG PO TABS: 1.0000 | ORAL_TABLET | Freq: Two times a day (BID) | ORAL | 0 refills | Status: DC

## 2020-04-30 NOTE — Progress Notes (Signed)
   Subjective:  Documentation for virtual audio and video telecommunications through [facility] encounter:  The patient was located at home. 2 patient identifiers used.  The provider was located in the office. The patient did consent to this visit and is aware of possible charges through their insurance for this visit.  The other persons participating in this telemedicine service were none. Time spent on call was 15  minutes and in review of previous records 20 minutes total.  This virtual service is not related to other E/M service within previous 7 days.   Patient ID: Megan [name], female    DOB: April 17, 1960, 60 y.o.   MRN: [id number]  HPI Chief Complaint  Patient presents with  . sinus infecton    Sinus infection since Tuesday.symptoms sore throat, hoarse, mucous, cough, pressure in face.    Complains of a 4-5 day history of fatigue, frontal headache, sinus pressure, nasal congestion, sore throat, and hoarseness. She is coughing up "chunky" yellow and blood tinged phlegm.  States she was getting better and then has felt worse since yesterday.  Low grade fever.   Denies chills, body aches, dizziness, chest pain, palpitations, shortness of breath, abdominal pain, N/V/D.   Using Flonase, Dayquil, Sudafed. Using nasal rinses.   Home test this morning is negative.   Reviewed allergies, medications, past medical, surgical, family, and social history.    Review of Systems Pertinent positives and negatives in the history of present illness.     Objective:   Physical Exam Wt 232 lb (105.2 kg)   BMI 34.26 kg/m   Alert and oriented in no acute distress.  Respirations unlabored.  Nasally congested and hoarseness noted.      Assessment & Plan:  Acute non-recurrent pansinusitis - Plan: amoxicillin-clavulanate (AUGMENTIN) 875-125 MG tablet  Cough  Augmentin prescribed.  In-depth counseling on symptomatic treatment including Tylenol or ibuprofen, Mucinex DM, saline nasal  rinses, Flonase, salt water gargles and good hydration.  Encouraged probiotic or yogurt to replace good bacteria while on the antibiotic. She will follow-up if worsening or not back to baseline when she completes the antibiotic.

## 2020-05-29 ENCOUNTER — Other Ambulatory Visit: Payer: Self-pay | Admitting: Family Medicine

## 2020-05-29 DIAGNOSIS — B001 Herpesviral vesicular dermatitis: Secondary | ICD-10-CM

## 2020-05-31 NOTE — Telephone Encounter (Signed)
Ok to refill?. Cpe was 12/21

## 2020-06-18 ENCOUNTER — Other Ambulatory Visit: Payer: Self-pay | Admitting: Family Medicine

## 2020-06-18 DIAGNOSIS — E039 Hypothyroidism, unspecified: Secondary | ICD-10-CM

## 2020-06-30 ENCOUNTER — Encounter: Payer: Self-pay | Admitting: Family Medicine

## 2020-06-30 ENCOUNTER — Ambulatory Visit: Payer: BC Managed Care – PPO | Admitting: Family Medicine

## 2020-06-30 ENCOUNTER — Other Ambulatory Visit: Payer: Self-pay

## 2020-06-30 VITALS — BP 120/78 | HR 85 | Wt 236.0 lb

## 2020-06-30 DIAGNOSIS — E039 Hypothyroidism, unspecified: Secondary | ICD-10-CM | POA: Diagnosis not present

## 2020-06-30 DIAGNOSIS — R7301 Impaired fasting glucose: Secondary | ICD-10-CM | POA: Diagnosis not present

## 2020-06-30 LAB — POCT GLYCOSYLATED HEMOGLOBIN (HGB A1C): Hemoglobin A1C: 5.6 % (ref 4.0–5.6)

## 2020-06-30 NOTE — Patient Instructions (Signed)
Continue being physically active.   Limit potatoes, pasta, bread, rice, alcohol and sugar.

## 2020-06-30 NOTE — Progress Notes (Signed)
   Subjective:    Patient ID: Megan [name], female    DOB: 1961/01/15, 60 y.o.   MRN: [id number]  HPI Chief Complaint  Patient presents with  . med check    Med check. Pre diabetes    She is here to follow up on prediabetes. Hgb A1c 5.9% in 12/2019  States she has been watching her carbohydrates but but could do better.  Denies sugary beverages. Margarita once monthly.  Beer occasionally  Reports being fairly active. Walks her dog. Goes to the gym twice per week.   Reports taking levothyroxine daily on an empty stomach.  No concerns.  Denies fever, chills, dizziness, chest pain, palpitations, shortness of breath, abdominal pain, N/V/D, urinary symptoms, LE edema.   Reviewed allergies, medications, past medical, surgical, family, and social history.    Review of Systems Pertinent positives and negatives in the history of present illness.     Objective:   Physical Exam BP 120/78   Pulse 85   Wt 236 lb (107 kg)   BMI 34.85 kg/m   Alert and oriented and in no acute distress.  Respirations unlabored.  Normal speech, mood and thought process.     Assessment & Plan:  Impaired fasting glucose - Plan: TSH, POCT glycosylated hemoglobin (Hb A1C)  Hypothyroidism, unspecified type - Plan: TSH  She is in her usual state of health. Hemoglobin A1c 5.6% and normal.  Counseling on low-carb, low sugar diet and continuing to be physically active. Check thyroid function and follow-up

## 2020-07-01 LAB — TSH: TSH: 1.2 u[IU]/mL (ref 0.450–4.500)

## 2020-09-17 ENCOUNTER — Other Ambulatory Visit: Payer: Self-pay | Admitting: Family Medicine

## 2020-09-17 DIAGNOSIS — E039 Hypothyroidism, unspecified: Secondary | ICD-10-CM

## 2020-12-20 ENCOUNTER — Telehealth: Payer: Self-pay

## 2020-12-20 DIAGNOSIS — E039 Hypothyroidism, unspecified: Secondary | ICD-10-CM

## 2020-12-20 MED ORDER — LEVOTHYROXINE SODIUM 125 MCG PO TABS: ORAL_TABLET | ORAL | 2 refills | Status: DC

## 2020-12-20 NOTE — Telephone Encounter (Signed)
Recv'd fax from [facility] requesting refill Levothyroxine

## 2020-12-20 NOTE — Telephone Encounter (Signed)
done

## 2020-12-29 ENCOUNTER — Encounter: Payer: BC Managed Care – PPO | Admitting: Family Medicine

## 2021-03-10 DIAGNOSIS — L821 Other seborrheic keratosis: Secondary | ICD-10-CM | POA: Diagnosis not present

## 2021-03-10 DIAGNOSIS — D225 Melanocytic nevi of trunk: Secondary | ICD-10-CM | POA: Diagnosis not present

## 2021-03-10 DIAGNOSIS — L814 Other melanin hyperpigmentation: Secondary | ICD-10-CM | POA: Diagnosis not present

## 2021-03-10 DIAGNOSIS — D2372 Other benign neoplasm of skin of left lower limb, including hip: Secondary | ICD-10-CM | POA: Diagnosis not present

## 2021-03-11 ENCOUNTER — Other Ambulatory Visit: Payer: Self-pay | Admitting: Family Medicine

## 2021-03-11 DIAGNOSIS — E039 Hypothyroidism, unspecified: Secondary | ICD-10-CM

## 2021-03-21 ENCOUNTER — Ambulatory Visit (INDEPENDENT_AMBULATORY_CARE_PROVIDER_SITE_OTHER): Payer: BC Managed Care – PPO | Admitting: Physician Assistant

## 2021-03-21 ENCOUNTER — Other Ambulatory Visit: Payer: Self-pay

## 2021-03-21 ENCOUNTER — Encounter: Payer: Self-pay | Admitting: Physician Assistant

## 2021-03-21 VITALS — BP 128/86 | HR 88 | Ht 69.0 in | Wt 243.2 lb

## 2021-03-21 DIAGNOSIS — E669 Obesity, unspecified: Secondary | ICD-10-CM

## 2021-03-21 DIAGNOSIS — R300 Dysuria: Secondary | ICD-10-CM

## 2021-03-21 DIAGNOSIS — R350 Frequency of micturition: Secondary | ICD-10-CM | POA: Diagnosis not present

## 2021-03-21 DIAGNOSIS — E782 Mixed hyperlipidemia: Secondary | ICD-10-CM

## 2021-03-21 DIAGNOSIS — R7301 Impaired fasting glucose: Secondary | ICD-10-CM | POA: Diagnosis not present

## 2021-03-21 DIAGNOSIS — E2839 Other primary ovarian failure: Secondary | ICD-10-CM

## 2021-03-21 DIAGNOSIS — J302 Other seasonal allergic rhinitis: Secondary | ICD-10-CM

## 2021-03-21 DIAGNOSIS — E039 Hypothyroidism, unspecified: Secondary | ICD-10-CM

## 2021-03-21 DIAGNOSIS — Z0000 Encounter for general adult medical examination without abnormal findings: Secondary | ICD-10-CM | POA: Diagnosis not present

## 2021-03-21 DIAGNOSIS — Z800 Family history of malignant neoplasm of digestive organs: Secondary | ICD-10-CM | POA: Insufficient documentation

## 2021-03-21 DIAGNOSIS — N3281 Overactive bladder: Secondary | ICD-10-CM

## 2021-03-21 LAB — POCT URINALYSIS DIP (PROADVANTAGE DEVICE)
Bilirubin, UA: NEGATIVE
Blood, UA: NEGATIVE
Glucose, UA: NEGATIVE mg/dL
Ketones, POC UA: NEGATIVE mg/dL
Leukocytes, UA: NEGATIVE
Nitrite, UA: NEGATIVE
Protein Ur, POC: NEGATIVE mg/dL
Specific Gravity, Urine: 1.005
Urobilinogen, Ur: NEGATIVE
pH, UA: 6 (ref 5.0–8.0)

## 2021-03-21 LAB — LIPID PANEL

## 2021-03-21 MED ORDER — LEVOTHYROXINE SODIUM 125 MCG PO TABS: ORAL_TABLET | ORAL | 2 refills | Status: DC

## 2021-03-21 MED ORDER — OXYBUTYNIN CHLORIDE 5 MG PO TABS: 5.0000 mg | ORAL_TABLET | Freq: Two times a day (BID) | ORAL | 6 refills | Status: DC

## 2021-03-21 NOTE — Patient Instructions (Signed)
From the early 90s to early 26s women can have perimenopausal symptoms as hormone levels in the body change. Perimenopause is the stage leading up to menopause. The symptoms can start gradually or all of a sudden at any age and can last for a various amount of time, from months to years. There is no quick fix for these symptoms.  (The definition of menopause if no menstrual cycle for 12 months in a row. The average age for menopause is usually age 59 or 47.)  Each person can have any or all of the following symptoms: Fatigue Hot flashes  Night sweats Abdominal bloating Trouble falling and staying asleep Mood swings Brain fog Aches and pains Heart palpitations Sore breasts Weight gain, especially the in the midsection/belly Dry, itchy skin including vaginal dryness Decreased sexual drive Headaches Overthinking Anxiety Depression  There are several over the counter substances that can help the symptoms. Some supplements combine some of the below ingredients.  Over the counter supplements for natural hormonal support: Wild yam Evening primrose oil Black cohosh Magnolia bark Omega 3s DIM Magnolia bark Red clover Chastetree berry [place]  Over the counter pamabrom, simethicone, ginger root, probiotics, digestive enzymes, Vitamin D, peppermint oil, or cinnamon oil to help with bloating.  Over the counter magnesium for sleep disturbances and constipation.  Over the counter Ashwagandha, Ginseng or Passion flower to help with stress.  You can also follow up with an ObGyn to ask to have your hormone levels checked and see if prescription hormone replacement is appropriate for you.

## 2021-03-21 NOTE — Progress Notes (Signed)
Established Patient Office Visit  Subjective:  Patient ID: Megan [name], female    DOB: [birth year]  Age: 61 y.o. MRN: [id number]  CC:  Chief Complaint  Patient presents with   Annual Exam    CPE had labs this morning. Urinary frequency and burning sometimes.    HPI Megan [name] presents for an annual exam.  Reports urinary frequency during the day and night and occasional burning; s/p bilateral tubal ligation; s/p uterine ablation; post-menopausal; had 3 children, 1 c-section, 2 NSVD; reports that she sips on water all day into the evening.  Past Medical History:  Diagnosis Date   Generalized headaches    seasonal   Hypothyroidism    Impaired fasting glucose 12/24/2018   Obesity (BMI 30-39.9) 12/23/2018   Seasonal allergies     Past Surgical History:  Procedure Laterality Date   APPENDECTOMY     GANGLION CYST EXCISION     LIPOMA EXCISION     right ovary removed      Family History  Problem Relation Age of Onset   Hypothyroidism Mother    Heart attack Mother    Hypertension Father    Hypothyroidism Brother    Skin cancer Brother    Hypothyroidism Daughter     Social History   Socioeconomic History   Marital status: Married    Spouse name: Not on file   Number of children: Not on file   Years of education: Not on file   Highest education level: Not on file  Occupational History   Not on file  Tobacco Use   Smoking status: Former    Packs/day: 0.50    Years: 20.00    Pack years: 10.00    Types: Cigarettes    Quit date: 11/05/1998    Years since quitting: 22.3   Smokeless tobacco: Never  Vaping Use   Vaping Use: Never used  Substance and Sexual Activity   Alcohol use: Yes    Comment: social   Drug use: No   Sexual activity: Yes    Partners: Male    Birth control/protection: Post-menopausal    Comment: ablation  Other Topics Concern   Not on file  Social History Narrative   Not on file   Social Determinants of Health   Financial  Resource Strain: Not on file  Food Insecurity: Not on file  Transportation Needs: Not on file  Physical Activity: Not on file  Stress: Not on file  Social Connections: Not on file  Intimate Partner Violence: Not on file    Outpatient Medications Prior to Visit  Medication Sig Dispense Refill   fluticasone (FLONASE) 50 MCG/ACT nasal spray 1 spray per nostril twice a day if needed for stuffy nose. 16 g 5   levothyroxine (SYNTHROID) 125 MCG tablet TAKE 1 TABLET BY MOUTH ONCE A DAY. TAKE ON AN EMPTY STOMACH WITH A GLASS OF WATER ATLEAST 30-60 MIN BEFORE BREAKFAST 30 tablet 2   IBUPROFEN PO Take by mouth as needed. (Patient not taking: Reported on 03/21/2021)     Pseudoephedrine HCl (SUDAFED PO) Take by mouth. (Patient not taking: Reported on 03/21/2021)     triamcinolone cream (KENALOG) 0.1 % Apply twice a day if needed to red itchy areas below the face. (Patient not taking: Reported on 03/21/2021) 45 g 3   valACYclovir (VALTREX) 1000 MG tablet TAKE 2 TABLETS BY MOUTH EVERY 12 HOURS FOR 1 DAY (Patient not taking: Reported on 03/21/2021) 30 tablet 2   No facility-administered medications prior  to visit.    No Known Allergies  ROS Review of Systems  Constitutional:  Negative for activity change and fever.  HENT:  Negative for congestion, ear pain and voice change.   Eyes:  Negative for redness.  Respiratory:  Negative for cough.   Cardiovascular:  Negative for chest pain.  Gastrointestinal:  Negative for constipation and diarrhea.  Endocrine: Negative for polyuria.  Genitourinary:  Positive for dysuria and frequency. Negative for difficulty urinating, flank pain and hematuria.  Musculoskeletal:  Negative for gait problem and neck stiffness.  Skin:  Negative for color change and rash.  Neurological:  Negative for dizziness.  Hematological:  Negative for adenopathy.  Psychiatric/Behavioral:  Negative for agitation, behavioral problems and confusion.      Objective:    Physical  Exam Vitals and nursing note reviewed.  Constitutional:      General: She is not in acute distress.    Appearance: Normal appearance.  HENT:     Head: Normocephalic and atraumatic.     Right Ear: Tympanic membrane, ear canal and external ear normal.     Left Ear: Tympanic membrane, ear canal and external ear normal.  Eyes:     Conjunctiva/sclera: Conjunctivae normal.     Pupils: Pupils are equal, round, and reactive to light.  Neck:     Vascular: No carotid bruit.  Cardiovascular:     Rate and Rhythm: Normal rate and regular rhythm.     Pulses: Normal pulses.     Heart sounds: Normal heart sounds.  Pulmonary:     Effort: Pulmonary effort is normal. No respiratory distress.     Breath sounds: Normal breath sounds. No wheezing.  Abdominal:     General: Bowel sounds are normal.     Palpations: Abdomen is soft.  Musculoskeletal:        General: Normal range of motion.     Cervical back: Normal range of motion and neck supple.     Right lower leg: No edema.     Left lower leg: No edema.  Skin:    General: Skin is warm and dry.     Findings: No rash.  Neurological:     General: No focal deficit present.     Mental Status: She is alert and oriented to person, place, and time.     Gait: Gait normal.  Psychiatric:        Mood and Affect: Mood normal.        Behavior: Behavior normal.    BP 128/86    Pulse 88    Ht '5\' 9"'  (1.753 m)    Wt 243 lb 3.2 oz (110.3 kg)    SpO2 95%    BMI 35.91 kg/m   BP Readings from Last 10 Encounters:  03/21/21 128/86  06/30/20 120/78  12/26/19 130/80  12/24/19 124/80  10/22/19 130/80  12/23/18 110/70  10/14/18 112/72  02/18/18 130/80  12/17/17 120/80  11/09/16 120/80     Wt Readings from Last 3 Encounters:  03/21/21 243 lb 3.2 oz (110.3 kg)  06/30/20 236 lb (107 kg)  04/30/20 232 lb (105.2 kg)     There are no preventive care reminders to display for this patient.   There are no preventive care reminders to display for this  patient.  Lab Results  Component Value Date   TSH 1.200 06/30/2020   Lab Results  Component Value Date   WBC 5.4 03/21/2021   HGB 15.8 03/21/2021   HCT 47.1 (H) 03/21/2021  MCV 94 03/21/2021   PLT 317 03/21/2021   Lab Results  Component Value Date   NA 143 03/21/2021   K 4.9 03/21/2021   CO2 24 03/21/2021   GLUCOSE 102 (H) 03/21/2021   BUN 11 03/21/2021   CREATININE 0.92 03/21/2021   BILITOT <0.2 03/21/2021   ALKPHOS 89 03/21/2021   AST 16 03/21/2021   ALT 19 03/21/2021   PROT 7.6 03/21/2021   ALBUMIN 5.0 (H) 03/21/2021   CALCIUM 10.1 03/21/2021   EGFR 72 03/21/2021   Lab Results  Component Value Date   CHOL 214 (H) 03/21/2021   Lab Results  Component Value Date   HDL 56 03/21/2021   Lab Results  Component Value Date   LDLCALC 126 (H) 03/21/2021   Lab Results  Component Value Date   TRIG 179 (H) 03/21/2021   Lab Results  Component Value Date   CHOLHDL 3.8 03/21/2021   Lab Results  Component Value Date   HGBA1C 5.9 (H) 03/21/2021      Assessment & Plan:   Problem List Items Addressed This Visit       Endocrine   Hypothyroidism   Relevant Medications   levothyroxine (SYNTHROID) 125 MCG tablet   Impaired fasting glucose   Relevant Orders   Comprehensive metabolic panel (Completed)   Hemoglobin A1c (Completed)     Genitourinary   OAB (overactive bladder)   Relevant Medications   oxybutynin (DITROPAN) 5 MG tablet     Other   Seasonal allergies   Estrogen deficiency   Obesity (BMI 30-39.9)   Mixed hyperlipidemia   Relevant Orders   Lipid panel (Completed)   Other Visit Diagnoses     Routine medical exam    -  Primary   Relevant Orders   CBC with Differential/Platelet (Completed)   Comprehensive metabolic panel (Completed)   Hemoglobin A1c (Completed)   Lipid panel (Completed)   Urinary frequency       Relevant Orders   POCT Urinalysis DIP (Proadvantage Device) (Completed)   Burning with urination       Relevant Orders   POCT  Urinalysis DIP (Proadvantage Device) (Completed)       Meds ordered this encounter  Medications   levothyroxine (SYNTHROID) 125 MCG tablet    Sig: TAKE 1 TABLET BY MOUTH ONCE A DAY. TAKE ON AN EMPTY STOMACH WITH A GLASS OF WATER ATLEAST 30-60 MIN BEFORE BREAKFAST    Dispense:  90 tablet    Refill:  2    Order Specific Question:   Supervising Provider    Answer:   Denita Lung [[id number]]   oxybutynin (DITROPAN) 5 MG tablet    Sig: Take 1 tablet (5 mg total) by mouth 2 (two) times daily.    Dispense:  60 tablet    Refill:  6    Order Specific Question:   Supervising Provider    Answer:   Denita Lung [[id number]]    Follow-up: Return in about 1 year (around 03/21/2022) for Return for Annual Exam with Pap Smear.   Urinalysis today is normal.  Irene Pap, PA-C

## 2021-03-22 ENCOUNTER — Encounter: Payer: Self-pay | Admitting: Physician Assistant

## 2021-03-22 LAB — LIPID PANEL
Chol/HDL Ratio: 3.8 ratio (ref 0.0–4.4)
Cholesterol, Total: 214 mg/dL — ABNORMAL HIGH (ref 100–199)
HDL: 56 mg/dL (ref >39)
LDL Chol Calc (NIH): 126 mg/dL — ABNORMAL HIGH (ref 0–99)
Triglycerides: 179 mg/dL — ABNORMAL HIGH (ref 0–149)
VLDL Cholesterol Cal: 32 mg/dL (ref 5–40)

## 2021-03-22 LAB — CBC WITH DIFFERENTIAL/PLATELET
Basophils Absolute: 0.1 10*3/uL (ref 0.0–0.2)
Basos: 1 %
EOS (ABSOLUTE): 0.2 10*3/uL (ref 0.0–0.4)
Eos: 3 %
Hematocrit: 47.1 % — ABNORMAL HIGH (ref 34.0–46.6)
Hemoglobin: 15.8 g/dL (ref 11.1–15.9)
Immature Grans (Abs): 0 10*3/uL (ref 0.0–0.1)
Immature Granulocytes: 0 %
Lymphocytes Absolute: 1.8 10*3/uL (ref 0.7–3.1)
Lymphs: 32 %
MCH: 31.6 pg (ref 26.6–33.0)
MCHC: 33.5 g/dL (ref 31.5–35.7)
MCV: 94 fL (ref 79–97)
Monocytes Absolute: 0.5 10*3/uL (ref 0.1–0.9)
Monocytes: 9 %
Neutrophils Absolute: 3 10*3/uL (ref 1.4–7.0)
Neutrophils: 55 %
Platelets: 317 10*3/uL (ref 150–450)
RBC: 5 x10E6/uL (ref 3.77–5.28)
RDW: 12.2 % (ref 11.7–15.4)
WBC: 5.4 10*3/uL (ref 3.4–10.8)

## 2021-03-22 LAB — COMPREHENSIVE METABOLIC PANEL
ALT: 19 IU/L (ref 0–32)
AST: 16 IU/L (ref 0–40)
Albumin/Globulin Ratio: 1.9 (ref 1.2–2.2)
Albumin: 5 g/dL — ABNORMAL HIGH (ref 3.8–4.9)
Alkaline Phosphatase: 89 IU/L (ref 44–121)
BUN/Creatinine Ratio: 12 (ref 9–23)
BUN: 11 mg/dL (ref 6–24)
Bilirubin Total: <0.2 mg/dL (ref 0.0–1.2)
CO2: 24 mmol/L (ref 20–29)
Calcium: 10.1 mg/dL (ref 8.7–10.2)
Chloride: 105 mmol/L (ref 96–106)
Creatinine, Ser: 0.92 mg/dL (ref 0.57–1.00)
Globulin, Total: 2.6 g/dL (ref 1.5–4.5)
Glucose: 102 mg/dL — ABNORMAL HIGH (ref 70–99)
Potassium: 4.9 mmol/L (ref 3.5–5.2)
Sodium: 143 mmol/L (ref 134–144)
Total Protein: 7.6 g/dL (ref 6.0–8.5)
eGFR: 72 mL/min/{1.73_m2} (ref >59)

## 2021-03-22 LAB — HEMOGLOBIN A1C
Est. average glucose Bld gHb Est-mCnc: 123 mg/dL
Hgb A1c MFr Bld: 5.9 % — ABNORMAL HIGH (ref 4.8–5.6)

## 2021-04-27 DIAGNOSIS — R350 Frequency of micturition: Secondary | ICD-10-CM | POA: Diagnosis not present

## 2021-04-27 DIAGNOSIS — R351 Nocturia: Secondary | ICD-10-CM | POA: Diagnosis not present

## 2021-06-07 ENCOUNTER — Telehealth: Payer: BC Managed Care – PPO | Admitting: Physician Assistant

## 2021-06-07 ENCOUNTER — Encounter: Payer: Self-pay | Admitting: Physician Assistant

## 2021-06-07 VITALS — Ht 69.0 in | Wt 240.0 lb

## 2021-06-07 DIAGNOSIS — J0140 Acute pansinusitis, unspecified: Secondary | ICD-10-CM

## 2021-06-07 DIAGNOSIS — J302 Other seasonal allergic rhinitis: Secondary | ICD-10-CM

## 2021-06-07 MED ORDER — CEFDINIR 300 MG PO CAPS: 300.0000 mg | ORAL_CAPSULE | Freq: Two times a day (BID) | ORAL | 0 refills | Status: DC

## 2021-06-07 NOTE — Patient Instructions (Addendum)
Rest, increase clear fluids, OTC Tylenol (acetamenophen) for body aches, headaches, fever, chills as needed.  ? ?You can take an over the counter antihistamine to help with allergic rhinitis / itching / hives: NON-DROWSY Allegra (Fexofenadine) 180 mg daily or NON-Drowsy Claritin (Loratidine) 10 mg daily  or DROWSY Benadryl (Diphenhydramine) 25 mg as directed or Zyrtec (Cetirizine) 10 mg daily. You can go to a store with a pharmacy and ask them to help you find these medicines. ? ?You can take an OTC decongestant like Sudafed or phenylephrine to help dry up nasal congestion. Do Not take this medicine if you have high blood pressure or heart palpitations. You can go to a store with a pharmacy and ask them to help you find these medicines. ? ?For nasal congestion and post nasal drip you can use an OTC nasal saline rinse as well as one of the OTC nasal steroids (generic equivalent) like Flonase (Fluticasone) or Rhinocort (Budesonide) or Nasacort (Triamcinolone) or Nasonex (Mometasone Furoate).  ? ? ?

## 2021-06-07 NOTE — Progress Notes (Signed)
Start time: 11:53 am End time: 12:13 pm  Virtual Visit via Video Note   Patient ID: Megan Megan [name], female    DOB: 13-Sep-1960, 61 y.o.   MRN: [id number]  I connected with above patient on 06/07/21 by a video enabled telemedicine application and verified that I am speaking with the correct person using two identifiers.  Location: Patient: home Provider: office   I discussed the limitations of evaluation and management by telemedicine and the availability of in person appointments. The patient expressed understanding and agreed to proceed.  History of Present Illness:  Chief Complaint  Patient presents with   Acute Visit    Virtual-cold for 3 weeks. Last night symptoms got worse, headache and green mucous. No covid test   Reports a 3 week history of a scratchy throat, nasal congestion, took ibuprofen &  decongestant that were helpful; yesterday started with a headache; last night took tylenol severe sinus; reports frontal headache; post-nasal drip that is green yellow; denies sinus surgery; has been to ENT and was told she told had a deviated septum, but chose not to have surgery to repair;  denies fever/chills, nausea/vomiting, diarrhea/constipation; denies cough; denies recent travel except to [place] in [place] last weekend; denies sick contacts    Observations/Objective:  Ht '5\' 9"'$  (1.753 m)   Wt 240 lb (108.9 kg)   BMI 35.44 kg/m    Assessment: Encounter Diagnosis  Name Primary?   Acute non-recurrent pansinusitis Yes     Plan: Rest, increase clear fluids, OTC Tylenol (acetamenophen) for body aches, headaches, fever, chills as needed.   You can take an over the counter antihistamine to help with allergic rhinitis / itching / hives: NON-DROWSY Allegra (Fexofenadine) 180 mg daily or NON-Drowsy Claritin (Loratidine) 10 mg daily  or DROWSY Benadryl (Diphenhydramine) 25 mg as directed or Zyrtec (Cetirizine) 10 mg daily. You can go to a store with a pharmacy and Megan [name] them to help  you find these medicines.  You can take an OTC decongestant like Sudafed or phenylephrine to help dry up nasal congestion. Do Not take this medicine if you have high blood pressure or heart palpitations. You can go to a store with a pharmacy and Megan [name] them to help you find these medicines.  For nasal congestion and post nasal drip you can use an OTC nasal saline rinse as well as one of the OTC nasal steroids (generic equivalent) like Flonase (Fluticasone) or Rhinocort (Budesonide) or Nasacort (Triamcinolone) or Nasonex (Mometasone Furoate).   Cefdinir 300 mg bid x 10 days  Megan [name] was seen today for acute visit.  Diagnoses and all orders for this visit:  Acute non-recurrent pansinusitis  Other orders -     cefdinir (OMNICEF) 300 MG capsule; Take 1 capsule (300 mg total) by mouth 2 (two) times daily.    Follow up: as scheduled   I discussed the assessment and treatment plan with the patient. The patient was provided an opportunity to Megan [name] questions and all were answered. The patient agreed with the plan and demonstrated an understanding of the instructions.   The patient was advised to call back or seek an in-person evaluation if the symptoms worsen or if the condition fails to improve as anticipated. For emergencies go to Urgent Care or the Emergency Department for immediate evaluation.   I spent 15 minutes dedicated to the care of this patient, including pre-visit review of records, face to face time, post-visit ordering of testing and documentation.    Irene Pap, PA-C

## 2021-06-09 ENCOUNTER — Encounter: Payer: Self-pay | Admitting: Physician Assistant

## 2021-06-17 DIAGNOSIS — R351 Nocturia: Secondary | ICD-10-CM | POA: Diagnosis not present

## 2021-06-17 DIAGNOSIS — R350 Frequency of micturition: Secondary | ICD-10-CM | POA: Diagnosis not present

## 2021-07-22 ENCOUNTER — Encounter: Payer: Self-pay | Admitting: Internal Medicine

## 2021-10-04 DIAGNOSIS — R350 Frequency of micturition: Secondary | ICD-10-CM | POA: Diagnosis not present

## 2021-10-04 DIAGNOSIS — R351 Nocturia: Secondary | ICD-10-CM | POA: Diagnosis not present

## 2021-12-06 ENCOUNTER — Encounter: Payer: Self-pay | Admitting: Internal Medicine

## 2021-12-13 ENCOUNTER — Other Ambulatory Visit: Payer: Self-pay | Admitting: Physician Assistant

## 2021-12-13 DIAGNOSIS — E039 Hypothyroidism, unspecified: Secondary | ICD-10-CM

## 2022-03-01 ENCOUNTER — Telehealth: Payer: BC Managed Care – PPO | Admitting: Medical

## 2022-03-01 ENCOUNTER — Other Ambulatory Visit (INDEPENDENT_AMBULATORY_CARE_PROVIDER_SITE_OTHER): Payer: BC Managed Care – PPO

## 2022-03-01 ENCOUNTER — Encounter: Payer: Self-pay | Admitting: Medical

## 2022-03-01 VITALS — Wt 235.0 lb

## 2022-03-01 DIAGNOSIS — J029 Acute pharyngitis, unspecified: Secondary | ICD-10-CM

## 2022-03-01 DIAGNOSIS — J3489 Other specified disorders of nose and nasal sinuses: Secondary | ICD-10-CM

## 2022-03-01 DIAGNOSIS — J988 Other specified respiratory disorders: Secondary | ICD-10-CM | POA: Diagnosis not present

## 2022-03-01 LAB — POC COVID19 BINAXNOW: SARS Coronavirus 2 Ag: NEGATIVE

## 2022-03-01 LAB — POCT INFLUENZA A/B
Influenza A, POC: NEGATIVE
Influenza B, POC: NEGATIVE

## 2022-03-01 NOTE — Progress Notes (Signed)
Subjective:     Patient ID: Megan [name], female   DOB: 10/08/1960, 62 y.o.   MRN: [id number]  This visit type was conducted due to national recommendations for restrictions regarding the COVID-19 Pandemic (e.g. social distancing) in an effort to limit this patient's exposure and mitigate transmission in our community.  Due to their co-morbid illnesses, this patient is at least at moderate risk for complications without adequate follow up.  This format is felt to be most appropriate for this patient at this time.    Documentation for virtual audio and video telecommunications through [facility] encounter:  The patient was located at home. The provider was located in the office. The patient did consent to this visit and is aware of possible charges through their insurance for this visit.  The other persons participating in this telemedicine service were none. Time spent on call was 20 minutes and in review of previous records 20 minutes total.  This virtual service is not related to other E/M service within previous 7 days.   HPI Chief Complaint  Patient presents with   sinus infection    Sinus infection x symptoms started Monday evening- nasal congestion- drainage, sneezing, throat hurts some, mucous yellow tan. Sinus pressure and headache   Virtual consult for sinus infection.  She notes symptoms x 3 days including nasal drainage, sneezing, sore throat, yellowish productive mucus, sinus pressure, headache.  Symptoms much worse yesterday.  Feels head and ears very clogged.  Lots of sneezing yesterday.  Used benadryl last night and that helped.   No sick contacts.  No recent covid test.   No fever, no body aches, no chills, no NVD.  No wheezing, no SOB.  Nonsmoker.  No other aggravating or relieving factors. No other complaint.   Past Medical History:  Diagnosis Date   Elevated LDL cholesterol level 12/17/2017   Generalized headaches    seasonal   Hypothyroidism    Impaired  fasting glucose 12/24/2018   Obesity (BMI 30-39.9) 12/23/2018   Seasonal allergies    Current Outpatient Medications on File Prior to Visit  Medication Sig Dispense Refill   fluticasone (FLONASE) 50 MCG/ACT nasal spray 1 spray per nostril twice a day if needed for stuffy nose. 16 g 5   levothyroxine (SYNTHROID) 125 MCG tablet TAKE 1 TAB BY MOUTH ONCE DAILY. TAKE ON AN EMPTY STOMACH WITH A GLASS OF WATER ATLEAST 30-60 MINUTES BEFORE BREAKFAST 30 tablet 0   No current facility-administered medications on file prior to visit.    Review of Systems As in subjective    Objective:   Physical Exam Due to coronavirus pandemic stay at home measures, patient visit was virtual and they were not examined in person.   Wt 235 lb (106.6 kg)   BMI 34.70 kg/m   Gen: Well-developed well-nourished no acute distress No labored breathing or wheezing     Assessment:     Encounter Diagnoses  Name Primary?   Sinus pressure Yes   Sore throat    Respiratory tract infection        Plan:     Symptoms suggest viral URI at this point.  Too early to call sinus infection diagnosis.  We discussed having her come up in our back parking lot for COVID screening.  She will come in and do that now.  Advise over-the-counter analgesics such as Tylenol once or twice a day to help with not feeling well or fever or aches Salt water gargles for sore throat Hydrate well  with clear fluids at least 60 ounces a day Consider over-the-counter cough and congestion medication such as Sudafed for congestion and pressure in sinuses and possibly Delsym over-the-counter for cough Consider nasal saline flush several times a day  Follow-up pending COVID test   Covid test negative.   For now conservative therapy for viral URI   Porshe was seen today for sinus infection.  Diagnoses and all orders for this visit:  Sinus pressure  Sore throat  Respiratory tract infection    Follow-up as needed

## 2022-03-01 NOTE — Progress Notes (Signed)
Covid test negative  I recommennd over-the-counter analgesics such as Tylenol once or twice a day to help with not feeling well or fever or aches  Salt water gargles for sore throat  Hydrate well with clear fluids at least 60 ounces a day  Consider over-the-counter cough and congestion medication such as Sudafed for congestion and pressure in sinuses and possibly Delsym over-the-counter for cough  Consider nasal saline flush several times a day  If not improving over the next 3-4 days, or if worse by Monday, then call back

## 2022-03-22 ENCOUNTER — Ambulatory Visit: Payer: BC Managed Care – PPO | Admitting: Nurse Practitioner

## 2022-03-22 ENCOUNTER — Encounter: Payer: Self-pay | Admitting: Nurse Practitioner

## 2022-03-22 VITALS — BP 124/82 | HR 84 | Ht 68.75 in | Wt 243.6 lb

## 2022-03-22 DIAGNOSIS — E782 Mixed hyperlipidemia: Secondary | ICD-10-CM | POA: Diagnosis not present

## 2022-03-22 DIAGNOSIS — Z1231 Encounter for screening mammogram for malignant neoplasm of breast: Secondary | ICD-10-CM

## 2022-03-22 DIAGNOSIS — H66001 Acute suppurative otitis media without spontaneous rupture of ear drum, right ear: Secondary | ICD-10-CM | POA: Insufficient documentation

## 2022-03-22 DIAGNOSIS — N3281 Overactive bladder: Secondary | ICD-10-CM

## 2022-03-22 DIAGNOSIS — E559 Vitamin D deficiency, unspecified: Secondary | ICD-10-CM

## 2022-03-22 DIAGNOSIS — E2839 Other primary ovarian failure: Secondary | ICD-10-CM

## 2022-03-22 DIAGNOSIS — Z0000 Encounter for general adult medical examination without abnormal findings: Secondary | ICD-10-CM | POA: Diagnosis not present

## 2022-03-22 DIAGNOSIS — R7301 Impaired fasting glucose: Secondary | ICD-10-CM

## 2022-03-22 DIAGNOSIS — E669 Obesity, unspecified: Secondary | ICD-10-CM | POA: Diagnosis not present

## 2022-03-22 DIAGNOSIS — E039 Hypothyroidism, unspecified: Secondary | ICD-10-CM

## 2022-03-22 MED ORDER — AZITHROMYCIN 250 MG PO TABS: ORAL_TABLET | ORAL | 0 refills | Status: AC

## 2022-03-22 NOTE — Assessment & Plan Note (Signed)
The patient is currently on levothyroxine and reports no known thyroid issues. However, there are concerns about energy levels that may be related to thyroid function. Thyroid examination is non-revealing today.  Plan: - Order a thyroid panel to assess the patient's current thyroid function and adjust medication if necessary.

## 2022-03-22 NOTE — Assessment & Plan Note (Signed)
The patient is concerned about weight gain and difficulty losing weight, despite maintaining a healthy diet and regular gym attendance. There is a history of slightly elevated A1C. We discussed the option that this could be related to underlying condition, possibly thyroid or blood sugar etiology.  Plan: - Order comprehensive lab work, including A1C, thyroid panel, and cholesterol, to investigate potential underlying issues affecting weight management. - Discuss lab results with the patient and explore treatment options based on findings. - Offer general recommendations for weight loss. - May consider medication options based on lab results.

## 2022-03-22 NOTE — Assessment & Plan Note (Signed)
The patient has a history of bladder pain and leakage, which has been managed effectively with Myrbetriq. The patient currently takes Myrbetriq every three days and wishes to discontinue the medication eventually. At this time her symptoms are reportedly well controlled with no concerns.  Plan: - Encourage the patient to continue the current Myrbetriq regimen  - Consider follow up with their urologist as planned for ongoing management and potential discontinuation of the medication.

## 2022-03-22 NOTE — Progress Notes (Signed)
[name], DNP, AGNP-c [place], [place] [address] Main Office [phone]  BP 124/82   Pulse 84   Ht 5' 8.75" (1.746 m)   Wt 243 lb 9.6 oz (110.5 kg)   BMI 36.24 kg/m    Subjective:    Patient ID: Megan [name], female    DOB: June 25, 1960, 62 y.o.   MRN: [id number]  HPI: Megan [name] is a 62 y.o. female presenting on 03/22/2022 for comprehensive medical examination.   Current medical concerns include: Zoriana presents today with concerns regarding a sensation of clogged ears, particularly noticeable when lying down, accompanied by an audible whooshing sound consistent with pulsatile tinnitus. She reports a decrease in energy levels and acknowledges the need for weight loss, which she describes as a lifelong battle. Delphia is uncertain about the date of her last pap smear. She denies any current thyroid issues, although she expresses concern that her energy levels might be related to her thyroid function.   Rivers's medical history includes ear tubes placement in childhood. She currently experiences intermittent ear pain that resolves spontaneously and mentions a recent cold. She underwent a COVID-19 and flu test approximately one month ago, suggesting a possible link to her current ear symptoms. Marrisa denies any fever associated with her ear condition.  Jeliah did have an issue with bladder pain during the summer, for which she consulted a urologist and was prescribed Myrbetriq. The medication provided significant relief, and she is currently tapering the dosage with the goal of discontinuation.  Regarding her diet, Ermila describes healthy eating habits during the day but struggles with evening snacking. She maintains a twice-weekly gym routine focused on weightlifting but notes a reduction in physical activity since the onset of the COVID-19 pandemic. Jaci mentions a past concern with blood sugar levels, specifically an elevated A1C that  she successfully managed to reduce.   IMMUNIZATIONS:   Flu: Flu vaccine completed elsewhere this season Prevnar 13: Prevnar 13 N/A for this patient Prevnar 20: Prevnar 20 N/A for this patient Pneumovax 23: Pneumovax 23 N/A for this patient Vac Shingrix: Shingrix N/A for this patient HPV: HPV N/A for this patient Tetanus: Tetanus completed in the last 10 years  HEALTH MAINTENANCE: Pap Smear HM Status: is up to date- due after 12/2022 Mammogram HM Status: ordered Colon Cancer Screening HM Status: is up to date Bone Density HM Status: is not applicable for this patient STI Testing HM Status: is not applicable for this patient  She reports regular vision exams q1-5y: Yes  She reports regular dental exams q 42m  Yes  The patient eats a regular, healthy diet. The patient snacks frequently. She endorses exercise and/or activity of:  weight lifting and working out.   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  Most Recent Depression Screen:     03/22/2022   10:30 AM 03/21/2021    1:50 PM 06/30/2020    8:25 AM 12/24/2019    8:15 AM 12/23/2018    9:33 AM  Depression screen PHQ 2/9  Decreased Interest 0 0 0 0 0  Down, Depressed, Hopeless 0 0 0 0 0  PHQ - 2 Score 0 0 0 0 0   Most Recent Anxiety Screen:      No data to display         Most Recent Fall Screen:    03/22/2022   10:30 AM 03/21/2021  1:50 PM 06/30/2020    8:24 AM 12/24/2019    8:15 AM 12/23/2018    9:33 AM  Fall Risk   Falls in the past year? 0 1 1 0 0  Number falls in past yr: 0 0 0 0 0  Injury with Fall? 0 0 0 0 0  Comment  just a bruise on her left knee     Risk for fall due to : No Fall Risks History of fall(s) Other (Comment)    Follow up Falls evaluation completed Falls evaluation completed Falls evaluation completed      Past medical history, surgical history, medications, allergies, family history and  social history reviewed with patient today and changes made to appropriate areas of the chart.  Past Medical History:  Past Medical History:  Diagnosis Date   Elevated LDL cholesterol level 12/17/2017   Generalized headaches    seasonal   Hypothyroidism    Impaired fasting glucose 12/24/2018   Obesity (BMI 30-39.9) 12/23/2018   Seasonal allergies    Medications:  Current Outpatient Medications on File Prior to Visit  Medication Sig   fluticasone (FLONASE) 50 MCG/ACT nasal spray 1 spray per nostril twice a day if needed for stuffy nose.   levothyroxine (SYNTHROID) 125 MCG tablet TAKE 1 TAB BY MOUTH ONCE DAILY. TAKE ON AN EMPTY STOMACH WITH A GLASS OF WATER ATLEAST 30-60 MINUTES BEFORE BREAKFAST   No current facility-administered medications on file prior to visit.   Surgical History:  Past Surgical History:  Procedure Laterality Date   APPENDECTOMY     GANGLION CYST EXCISION     LIPOMA EXCISION     right ovary removed     Allergies:  No Known Allergies Family History:  Family History  Problem Relation Age of Onset   Hypothyroidism Mother    Heart attack Mother    Hypertension Father    Hypothyroidism Brother    Skin cancer Brother    Hypothyroidism Daughter        Objective:    BP 124/82   Pulse 84   Ht 5' 8.75" (1.746 m)   Wt 243 lb 9.6 oz (110.5 kg)   BMI 36.24 kg/m   Wt Readings from Last 3 Encounters:  03/22/22 243 lb 9.6 oz (110.5 kg)  03/01/22 235 lb (106.6 kg)  06/07/21 240 lb (108.9 kg)    Physical Exam Vitals and nursing note reviewed.  Constitutional:      General: She is not in acute distress.    Appearance: Normal appearance. She is obese.  HENT:     Head: Normocephalic and atraumatic.     Right Ear: Hearing, ear canal and external ear normal. A middle ear effusion is present. Tympanic membrane is scarred.     Left Ear: Hearing, ear canal and external ear normal. A middle ear effusion is present.     Nose: Nose normal.     Right Sinus: No  maxillary sinus tenderness or frontal sinus tenderness.     Left Sinus: No maxillary sinus tenderness or frontal sinus tenderness.     Mouth/Throat:     Lips: Pink.     Mouth: Mucous membranes are moist.     Pharynx: Oropharynx is clear.  Eyes:     General: Lids are normal. Vision grossly intact.     Extraocular Movements: Extraocular movements intact.     Conjunctiva/sclera: Conjunctivae normal.     Pupils: Pupils are equal, round, and reactive to light.     Funduscopic exam:  Right eye: Red reflex present.        Left eye: Red reflex present.    Visual Fields: Right eye visual fields normal and left eye visual fields normal.  Neck:     Thyroid: No thyromegaly.     Vascular: No carotid bruit.  Cardiovascular:     Rate and Rhythm: Normal rate and regular rhythm.     Chest Wall: PMI is not displaced.     Pulses: Normal pulses.          Dorsalis pedis pulses are 2+ on the right side and 2+ on the left side.       Posterior tibial pulses are 2+ on the right side and 2+ on the left side.     Heart sounds: Normal heart sounds. No murmur heard. Pulmonary:     Effort: Pulmonary effort is normal. No respiratory distress.     Breath sounds: Normal breath sounds.  Abdominal:     General: Abdomen is flat. Bowel sounds are normal. There is no distension.     Palpations: Abdomen is soft. There is no hepatomegaly, splenomegaly or mass.     Tenderness: There is no abdominal tenderness. There is no right CVA tenderness, left CVA tenderness, guarding or rebound.  Musculoskeletal:        General: Normal range of motion.     Cervical back: Full passive range of motion without pain, normal range of motion and neck supple. No tenderness.     Right lower leg: No edema.     Left lower leg: No edema.  Feet:     Left foot:     Toenail Condition: Left toenails are normal.  Lymphadenopathy:     Cervical: No cervical adenopathy.     Upper Body:     Right upper body: No supraclavicular adenopathy.      Left upper body: No supraclavicular adenopathy.  Skin:    General: Skin is warm and dry.     Capillary Refill: Capillary refill takes less than 2 seconds.     Nails: There is no clubbing.  Neurological:     General: No focal deficit present.     Mental Status: She is alert and oriented to person, place, and time.     GCS: GCS eye subscore is 4. GCS verbal subscore is 5. GCS motor subscore is 6.     Sensory: Sensation is intact.     Motor: Motor function is intact.     Coordination: Coordination is intact.     Gait: Gait is intact.     Deep Tendon Reflexes: Reflexes are normal and symmetric.  Psychiatric:        Attention and Perception: Attention normal.        Mood and Affect: Mood normal.        Speech: Speech normal.        Behavior: Behavior normal. Behavior is cooperative.        Thought Content: Thought content normal.        Cognition and Memory: Cognition and memory normal.        Judgment: Judgment normal.     Results for orders placed or performed in visit on 03/01/22  POC COVID-19  Result Value Ref Range   SARS Coronavirus 2 Ag Negative Negative  Influenza A/B  Result Value Ref Range   Influenza A, POC Negative Negative   Influenza B, POC Negative Negative         Assessment & Plan:   Problem  List Items Addressed This Visit     Hypothyroidism    The patient is currently on levothyroxine and reports no known thyroid issues. However, there are concerns about energy levels that may be related to thyroid function. Thyroid examination is non-revealing today.  Plan: - Order a thyroid panel to assess the patient's current thyroid function and adjust medication if necessary.      Relevant Orders   CBC with Differential/Platelet   Comprehensive metabolic panel   Hemoglobin A1c   Iron, TIBC and Ferritin Panel   Lipid panel   TSH   VITAMIN D 25 Hydroxy (Vit-D Deficiency, Fractures)   T4, free   Obesity (BMI 30-39.9)     The patient is concerned about weight  gain and difficulty losing weight, despite maintaining a healthy diet and regular gym attendance. There is a history of slightly elevated A1C. We discussed the option that this could be related to underlying condition, possibly thyroid or blood sugar etiology.  Plan: - Order comprehensive lab work, including A1C, thyroid panel, and cholesterol, to investigate potential underlying issues affecting weight management. - Discuss lab results with the patient and explore treatment options based on findings. - Offer general recommendations for weight loss. - May consider medication options based on lab results.       Relevant Orders   CBC with Differential/Platelet   Comprehensive metabolic panel   Hemoglobin A1c   Iron, TIBC and Ferritin Panel   Lipid panel   TSH   VITAMIN D 25 Hydroxy (Vit-D Deficiency, Fractures)   T4, free   Impaired fasting glucose    History of elevated blood glucose readings with pre-diabetes present. No recent labs available for review at this time. Given the patients difficulty with weight management, there very well could be a contributing component with insulin resistance present.  Plan: - monitor labs today for evidence of insulin resistance - formulate diet and exercise plan based on specific needs related to labs - consider medication options that may be helpful for weight and chronic medical conditions, as necessary       Relevant Orders   CBC with Differential/Platelet   Comprehensive metabolic panel   Hemoglobin A1c   Iron, TIBC and Ferritin Panel   Lipid panel   TSH   VITAMIN D 25 Hydroxy (Vit-D Deficiency, Fractures)   T4, free   Mixed hyperlipidemia    Historic elevation in lipids with no current medication management.  Plan: - labs pending for further evaluation.       Relevant Orders   CBC with Differential/Platelet   Comprehensive metabolic panel   Hemoglobin A1c   Iron, TIBC and Ferritin Panel   Lipid panel   TSH   VITAMIN D 25 Hydroxy  (Vit-D Deficiency, Fractures)   T4, free   OAB (overactive bladder)    The patient has a history of bladder pain and leakage, which has been managed effectively with Myrbetriq. The patient currently takes Myrbetriq every three days and wishes to discontinue the medication eventually. At this time her symptoms are reportedly well controlled with no concerns.  Plan: - Encourage the patient to continue the current Myrbetriq regimen  - Consider follow up with their urologist as planned for ongoing management and potential discontinuation of the medication.      Non-recurrent acute suppurative otitis media of right ear without spontaneous rupture of tympanic membrane    The patient reports a sensation of clogged ears and occasional pain, suggesting the presence of fluid and a potential [place]-stage  ear infection. No fever or severe pain is present. The patient has a history of ear tubes in childhood. Ears show bilateral effusions with worse symptoms on the left. No lymphadenopathy is present at this time.  Plan: - Prescribe an antibiotic for the patient to have on hand in case symptoms worsen or pain develops. She will monitor.  - Advise the patient to continue using Flonase and allergy medications as currently prescribed. - Recommend the continued use of a neti pot for sinus management.      Relevant Medications   azithromycin (ZITHROMAX) 250 MG tablet   Encounter for annual physical exam - Primary    CPE today with no abnormalities noted on exam.  Labs pending. Will make changes as necessary based on results.  Review of HM activities and recommendations discussed and provided on AVS Anticipatory guidance, diet, and exercise recommendations provided.  Medications, allergies, and hx reviewed and updated as necessary.  Plan to f/u with CPE in 1 year or sooner for acute/chronic health needs as directed.        Estrogen deficiency   Relevant Orders   CBC with Differential/Platelet    Comprehensive metabolic panel   Hemoglobin A1c   Iron, TIBC and Ferritin Panel   Lipid panel   TSH   VITAMIN D 25 Hydroxy (Vit-D Deficiency, Fractures)   T4, free   Other Visit Diagnoses     Health care maintenance       Relevant Orders   CBC with Differential/Platelet   Comprehensive metabolic panel   Hemoglobin A1c   Iron, TIBC and Ferritin Panel   Lipid panel   TSH   VITAMIN D 25 Hydroxy (Vit-D Deficiency, Fractures)   T4, free   Screening mammogram for breast cancer       Relevant Orders   MM 3D SCREEN BREAST BILATERAL          Follow up plan: Return in about 1 year (around 03/23/2023) for CPE.  NEXT PREVENTATIVE PHYSICAL DUE IN 1 YEAR.  PATIENT COUNSELING PROVIDED FOR ALL ADULT PATIENTS:  Consume a well balanced diet low in saturated fats, cholesterol, and moderation in carbohydrates.   This can be as simple as monitoring portion sizes and cutting back on sugary beverages such as soda and juice to start with.    Daily water consumption of at least 64 ounces.  Physical activity at least 180 minutes per week, if just starting out.   This can be as simple as taking the stairs instead of the elevator and walking 2-3 laps around the office  purposefully every day.   STD protection, partner selection, and regular testing if high risk.  Limited consumption of alcoholic beverages if alcohol is consumed.  For women, I recommend no more than 7 alcoholic beverages per week, spread out throughout the week.  Avoid "binge" drinking or consuming large quantities of alcohol in one setting.   Please let me know if you feel you may need help with reduction or quitting alcohol consumption.   Avoidance of nicotine, if used.  Please let me know if you feel you may need help with reduction or quitting nicotine use.   Daily mental health attention.  This can be in the form of 5 minute daily meditation, prayer, journaling, yoga, reflection, etc.   Purposeful attention to your  emotions and mental state can significantly improve your overall wellbeing and Health.  Please know that I am here to help you with all of your health care goals and am  happy to work with you to find a solution that works best for you.  The greatest advice I have received with any changes in life are to take it one step at a time, that even means if all you can focus on is the next 60 seconds, then do that and celebrate your victories.  With any changes in life, you will have set backs, and that is OK. The important thing to remember is, if you have a set back, it is not a failure, it is an opportunity to try again!  Health Maintenance Recommendations Screening Testing Mammogram Every 1 -2 years based on history and risk factors Starting at age 73 Pap Smear Ages 21-39 every 3 years Ages 66-65 every 5 years with HPV testing More frequent testing may be required based on results and history Colon Cancer Screening Every 1-10 years based on test performed, risk factors, and history Starting at age 34 Bone Density Screening Every 2-10 years based on history Starting at age 26 for women Recommendations for men differ based on medication usage, history, and risk factors AAA Screening One time ultrasound Men 74-72 years old who have every smoked Lung Cancer Screening Low Dose Lung CT every 12 months Age 33-80 years with a 30 pack-year smoking history who still smoke or who have quit within the last 15 years  Screening Labs Routine  Labs: Complete Blood Count (CBC), Complete Metabolic Panel (CMP), Cholesterol (Lipid Panel) Every 6-12 months based on history and medications May be recommended more frequently based on current conditions or previous results Hemoglobin A1c Lab Every 3-12 months based on history and previous results Starting at age 49 or earlier with diagnosis of diabetes, high cholesterol, BMI >26, and/or risk factors Frequent monitoring for patients with diabetes to ensure  blood sugar control Thyroid Panel (TSH w/ T3 & T4) Every 6 months based on history, symptoms, and risk factors May be repeated more often if on medication HIV One time testing for all patients 60 and older May be repeated more frequently for patients with increased risk factors or exposure Hepatitis C One time testing for all patients 107 and older May be repeated more frequently for patients with increased risk factors or exposure Gonorrhea, Chlamydia Every 12 months for all sexually active persons 13-24 years Additional monitoring may be recommended for those who are considered high risk or who have symptoms PSA Men 68-47 years old with risk factors Additional screening may be recommended from age 32-69 based on risk factors, symptoms, and history  Vaccine Recommendations Tetanus Booster All adults every 10 years Flu Vaccine All patients 6 months and older every year COVID Vaccine All patients 12 years and older Initial dosing with booster May recommend additional booster based on age and health history HPV Vaccine 2 doses all patients age 41-26 Dosing may be considered for patients over 26 Shingles Vaccine (Shingrix) 2 doses all adults 29 years and older Pneumonia (Pneumovax 23) All adults 31 years and older May recommend earlier dosing based on health history Pneumonia (Prevnar 2) All adults 25 years and older Dosed 1 year after Pneumovax 23  Additional Screening, Testing, and Vaccinations may be recommended on an individualized basis based on family history, health history, risk factors, and/or exposure.

## 2022-03-22 NOTE — Assessment & Plan Note (Signed)
History of elevated blood glucose readings with pre-diabetes present. No recent labs available for review at this time. Given the patients difficulty with weight management, there very well could be a contributing component with insulin resistance present.  Plan: - monitor labs today for evidence of insulin resistance - formulate diet and exercise plan based on specific needs related to labs - consider medication options that may be helpful for weight and chronic medical conditions, as necessary

## 2022-03-22 NOTE — Assessment & Plan Note (Signed)

## 2022-03-22 NOTE — Assessment & Plan Note (Signed)
Historic elevation in lipids with no current medication management.  Plan: - labs pending for further evaluation.

## 2022-03-22 NOTE — Patient Instructions (Addendum)
WEIGHT LOSS PLANNING Your progress today shows:     03/22/2022   10:30 AM 03/01/2022    1:35 PM 06/07/2021   11:49 AM  Vitals with BMI  Height 5' 8.75"  '5\' 9"'$   Weight 243 lbs 10 oz 235 lbs 240 lbs  BMI [id number]  0000000  Systolic [id number]    Diastolic 82    Pulse 84      For best management of weight, it is vital to balance intake versus output. This means the number of calories burned per day must be less than the calories you take in with food and drink.   I recommend trying to follow a diet with the following: Calories: 1200-1500 calories per day Carbohydrates: 150-180 grams of carbohydrates per day  Why: Gives your body enough "quick fuel" for cells to maintain normal function without sending them into starvation mode.  Protein: At least 90 grams of protein per day (usually divided among three meals) Why: Protein takes longer and uses more energy than carbohydrates to break down for fuel. The carbohydrates in your meals serves as quick energy sources and proteins help use some of that extra quick energy to break down to produce long term energy. This helps you not feel hungry as quickly and protein breakdown burns calories.  Water: Drink AT LEAST 64 ounces of water per day  Why: Water is essential to healthy metabolism. Water helps to fill the stomach and keep you fuller longer. Water is required for healthy digestion and filtering of waste in the body.  Fat: Limit fats in your diet- when choosing fats, choose foods with lower fats content such as lean meats (chicken, fish, [place]).  Why: Increased fat intake leads to storage "for later". Once you burn your carbohydrate energy, your body goes into fat and protein breakdown mode to help you loose weight.  Cholesterol: Fats and oils that are LIQUID at room temperature are best. Choose vegetable oils (olive oil, avocado oil, nuts). Avoid fats that are SOLID at room temperature (animal fats, processed meats). Healthy fats are often found in whole  grains, beans, nuts, seeds, and berries.  Why: Elevated cholesterol levels lead to build up of cholesterol on the inside of your blood vessels. This will eventually cause the blood vessels to become hard and can lead to high blood pressure and damage to your organs. When the blood flow is reduced, but the pressure is high from cholesterol buildup, parts of the cholesterol can break off and form clots that can go to the brain or heart leading to a stroke or heart attack.  Fiber: Increase amount of SOLUBLE the fiber in your diet. This helps to fill you up, lowers cholesterol, and helps with digestion. Some foods high in soluble fiber are oats, peas, beans, apples, carrots, barley, and citrus fruits.   Why: Fiber fills you up, helps remove excess cholesterol, and aids in healthy digestion which are all very important in weight management.   I recommend the following as a minimum activity routine: Purposeful walk or other physical activity at least 20 minutes every single day. This means purposefully taking a walk, jog, bike, swim, treadmill, elliptical, dance, etc.  This activity should be ABOVE your normal daily activities, such as walking at work. Goal exercise should be at least 150 minutes a week- work your way up to this.   Heart Rate: Your maximum exercise heart rate should be 220 - Your Age in Years. When exercising, get your heart rate up, but avoid  going over the maximum targeted heart rate.  60-70% of your maximum heart rate is where you tend to burn the most fat. To find this number:  220 - Age In Years= Max HR  Max HR x 0.6 (or 0.7) = Fat Burning HR The Fat Burning HR is your goal heart rate while working out to burn the most fat.  NEVER exercise to the point your feel lightheaded, weak, nauseated, dizzy. If you experience ANY of these symptoms- STOP exercise! Allow yourself to cool down and your heart rate to come down. Then restart slower next time.  If at [place] you feel chest pain  or chest pressure during exercise, STOP IMMEDIATELY and seek medical attention.

## 2022-03-22 NOTE — Assessment & Plan Note (Signed)
The patient reports a sensation of clogged ears and occasional pain, suggesting the presence of fluid and a potential Megan [name]-stage ear infection. No fever or severe pain is present. The patient has a history of ear tubes in childhood. Ears show bilateral effusions with worse symptoms on the left. No lymphadenopathy is present at this time.  Plan: - Prescribe an antibiotic for the patient to have on hand in case symptoms worsen or pain develops. She will monitor.  - Advise the patient to continue using Flonase and allergy medications as currently prescribed. - Recommend the continued use of a neti pot for sinus management.

## 2022-03-23 LAB — COMPREHENSIVE METABOLIC PANEL
ALT: 25 IU/L (ref 0–32)
AST: 23 IU/L (ref 0–40)
Albumin/Globulin Ratio: 2.1 (ref 1.2–2.2)
Albumin: 5 g/dL — ABNORMAL HIGH (ref 3.8–4.9)
Alkaline Phosphatase: 86 IU/L (ref 44–121)
BUN/Creatinine Ratio: 12 (ref 9–23)
BUN: 11 mg/dL (ref 6–24)
Bilirubin Total: 0.3 mg/dL (ref 0.0–1.2)
CO2: 23 mmol/L (ref 20–29)
Calcium: 9.9 mg/dL (ref 8.7–10.2)
Chloride: 103 mmol/L (ref 96–106)
Creatinine, Ser: 0.94 mg/dL (ref 0.57–1.00)
Globulin, Total: 2.4 g/dL (ref 1.5–4.5)
Glucose: 101 mg/dL — ABNORMAL HIGH (ref 70–99)
Potassium: 5.2 mmol/L (ref 3.5–5.2)
Sodium: 141 mmol/L (ref 134–144)
Total Protein: 7.4 g/dL (ref 6.0–8.5)
eGFR: 70 mL/min/{1.73_m2} (ref >59)

## 2022-03-23 LAB — IRON,TIBC AND FERRITIN PANEL
Ferritin: 250 ng/mL — ABNORMAL HIGH (ref 15–150)
Iron Saturation: 37 % (ref 15–55)
Iron: 113 ug/dL (ref 27–159)
Total Iron Binding Capacity: 308 ug/dL (ref 250–450)
UIBC: 195 ug/dL (ref 131–425)

## 2022-03-23 LAB — CBC WITH DIFFERENTIAL/PLATELET
Basophils Absolute: 0.1 10*3/uL (ref 0.0–0.2)
Basos: 1 %
EOS (ABSOLUTE): 0.2 10*3/uL (ref 0.0–0.4)
Eos: 3 %
Hematocrit: 46.8 % — ABNORMAL HIGH (ref 34.0–46.6)
Hemoglobin: 15.5 g/dL (ref 11.1–15.9)
Immature Grans (Abs): 0 10*3/uL (ref 0.0–0.1)
Immature Granulocytes: 0 %
Lymphocytes Absolute: 2.2 10*3/uL (ref 0.7–3.1)
Lymphs: 37 %
MCH: 30.9 pg (ref 26.6–33.0)
MCHC: 33.1 g/dL (ref 31.5–35.7)
MCV: 93 fL (ref 79–97)
Monocytes Absolute: 0.6 10*3/uL (ref 0.1–0.9)
Monocytes: 10 %
Neutrophils Absolute: 2.9 10*3/uL (ref 1.4–7.0)
Neutrophils: 49 %
Platelets: 330 10*3/uL (ref 150–450)
RBC: 5.02 x10E6/uL (ref 3.77–5.28)
RDW: 11.9 % (ref 11.7–15.4)
WBC: 5.8 10*3/uL (ref 3.4–10.8)

## 2022-03-23 LAB — LIPID PANEL
Chol/HDL Ratio: 4.2 ratio (ref 0.0–4.4)
Cholesterol, Total: 245 mg/dL — ABNORMAL HIGH (ref 100–199)
HDL: 58 mg/dL (ref >39)
LDL Chol Calc (NIH): 161 mg/dL — ABNORMAL HIGH (ref 0–99)
Triglycerides: 147 mg/dL (ref 0–149)
VLDL Cholesterol Cal: 26 mg/dL (ref 5–40)

## 2022-03-23 LAB — VITAMIN D 25 HYDROXY (VIT D DEFICIENCY, FRACTURES): Vit D, 25-Hydroxy: 16.7 ng/mL — ABNORMAL LOW (ref 30.0–100.0)

## 2022-03-23 LAB — T4, FREE: Free T4: 1.58 ng/dL (ref 0.82–1.77)

## 2022-03-23 LAB — HEMOGLOBIN A1C
Est. average glucose Bld gHb Est-mCnc: 123 mg/dL
Hgb A1c MFr Bld: 5.9 % — ABNORMAL HIGH (ref 4.8–5.6)

## 2022-03-23 LAB — TSH: TSH: 0.907 u[IU]/mL (ref 0.450–4.500)

## 2022-03-27 ENCOUNTER — Encounter: Payer: BC Managed Care – PPO | Admitting: Physician Assistant

## 2022-03-28 MED ORDER — VITAMIN D (ERGOCALCIFEROL) 1.25 MG (50000 UNIT) PO CAPS: 50000.0000 [IU] | ORAL_CAPSULE | ORAL | 1 refills | Status: DC

## 2022-03-28 NOTE — Addendum Note (Signed)
Addended by: Deberah Adolf, Clarise Cruz E on: 03/28/2022 07:51 AM   Modules accepted: Orders

## 2022-04-17 ENCOUNTER — Encounter: Payer: Self-pay | Admitting: Nurse Practitioner

## 2022-04-17 DIAGNOSIS — E782 Mixed hyperlipidemia: Secondary | ICD-10-CM

## 2022-04-17 DIAGNOSIS — E669 Obesity, unspecified: Secondary | ICD-10-CM

## 2022-04-17 DIAGNOSIS — R7301 Impaired fasting glucose: Secondary | ICD-10-CM

## 2022-04-18 MED ORDER — ZEPBOUND 2.5 MG/0.5ML ~~LOC~~ SOAJ: 2.5000 mg | SUBCUTANEOUS | 0 refills | Status: AC

## 2022-05-05 ENCOUNTER — Other Ambulatory Visit: Payer: Self-pay | Admitting: Medical

## 2022-05-05 DIAGNOSIS — E039 Hypothyroidism, unspecified: Secondary | ICD-10-CM

## 2022-05-09 ENCOUNTER — Ambulatory Visit
Admission: RE | Admit: 2022-05-09 | Discharge: 2022-05-09 | Disposition: A | Discharge status: Home / Self Care | Payer: BC Managed Care – PPO | Source: Ambulatory Visit | Attending: Nurse Practitioner | Admitting: Nurse Practitioner

## 2022-05-09 DIAGNOSIS — Z1231 Encounter for screening mammogram for malignant neoplasm of breast: Secondary | ICD-10-CM | POA: Diagnosis not present

## 2022-05-10 ENCOUNTER — Other Ambulatory Visit: Payer: Self-pay

## 2022-05-10 DIAGNOSIS — B001 Herpesviral vesicular dermatitis: Secondary | ICD-10-CM

## 2022-05-10 MED ORDER — VALACYCLOVIR HCL 1 G PO TABS
ORAL_TABLET | ORAL | 5 refills | Status: AC
Start: 2022-05-10 — End: ?

## 2022-05-22 NOTE — Telephone Encounter (Signed)
Called pharmacy & Zepbound is not covered, they will fax rejection for P.A.

## 2022-05-23 ENCOUNTER — Telehealth: Payer: Self-pay

## 2022-05-23 NOTE — Telephone Encounter (Signed)
PA for Zepbound has been denied for the following reason: "This health benefit plan does not cover the following services, supplies, drugs or charges: Any treatment or regimen, medical or surgical, for the purpose of reducing or controlling the weight of the member, or for the treatment of obesity, except for surgical treatment of morbid obesity, or as specifically covered by this health benefit plan."

## 2022-07-24 ENCOUNTER — Other Ambulatory Visit: Payer: Self-pay | Admitting: Nurse Practitioner

## 2022-07-24 DIAGNOSIS — E039 Hypothyroidism, unspecified: Secondary | ICD-10-CM

## 2022-07-29 ENCOUNTER — Emergency Department
Admission: EM | Admit: 2022-07-29 | Discharge: 2022-07-29 | Disposition: A | Discharge status: Home / Self Care | Payer: 59 | Attending: Emergency Medicine | Admitting: Emergency Medicine

## 2022-07-29 ENCOUNTER — Other Ambulatory Visit: Payer: Self-pay

## 2022-07-29 ENCOUNTER — Emergency Department: Payer: 59

## 2022-07-29 ENCOUNTER — Encounter: Payer: Self-pay | Admitting: Radiology

## 2022-07-29 DIAGNOSIS — N132 Hydronephrosis with renal and ureteral calculous obstruction: Secondary | ICD-10-CM | POA: Insufficient documentation

## 2022-07-29 DIAGNOSIS — R1030 Lower abdominal pain, unspecified: Secondary | ICD-10-CM | POA: Diagnosis present

## 2022-07-29 DIAGNOSIS — N200 Calculus of kidney: Secondary | ICD-10-CM

## 2022-07-29 LAB — CBC
HCT: 45.8 % (ref 36.0–46.0)
Hemoglobin: 14.8 g/dL (ref 12.0–15.0)
MCH: 31.5 pg (ref 26.0–34.0)
MCHC: 32.3 g/dL (ref 30.0–36.0)
MCV: 97.4 fL (ref 80.0–100.0)
Platelets: 272 10*3/uL (ref 150–400)
RBC: 4.7 MIL/uL (ref 3.87–5.11)
RDW: 12 % (ref 11.5–15.5)
WBC: 9.6 10*3/uL (ref 4.0–10.5)
nRBC: 0 % (ref 0.0–0.2)

## 2022-07-29 LAB — COMPREHENSIVE METABOLIC PANEL
ALT: 23 U/L (ref 0–44)
AST: 27 U/L (ref 15–41)
Albumin: 4.5 g/dL (ref 3.5–5.0)
Alkaline Phosphatase: 72 U/L (ref 38–126)
Anion gap: 8 (ref 5–15)
BUN: 23 mg/dL — ABNORMAL HIGH (ref 6–20)
CO2: 23 mmol/L (ref 22–32)
Calcium: 9.2 mg/dL (ref 8.9–10.3)
Chloride: 106 mmol/L (ref 98–111)
Creatinine, Ser: 0.95 mg/dL (ref 0.44–1.00)
GFR, Estimated: >60 mL/min (ref >60)
Glucose, Bld: 108 mg/dL — ABNORMAL HIGH (ref 70–99)
Potassium: 3.8 mmol/L (ref 3.5–5.1)
Sodium: 137 mmol/L (ref 135–145)
Total Bilirubin: 0.4 mg/dL (ref 0.3–1.2)
Total Protein: 7.5 g/dL (ref 6.5–8.1)

## 2022-07-29 LAB — URINALYSIS, ROUTINE W REFLEX MICROSCOPIC
Bacteria, UA: NONE SEEN
Bilirubin Urine: NEGATIVE
Glucose, UA: NEGATIVE mg/dL
Ketones, ur: NEGATIVE mg/dL
Nitrite: NEGATIVE
Protein, ur: NEGATIVE mg/dL
Specific Gravity, Urine: 1.01 (ref 1.005–1.030)
pH: 5 (ref 5.0–8.0)

## 2022-07-29 LAB — LIPASE, BLOOD: Lipase: 32 U/L (ref 11–51)

## 2022-07-29 LAB — POC URINE PREG, ED: Preg Test, Ur: NEGATIVE

## 2022-07-29 MED ORDER — KETOROLAC TROMETHAMINE 10 MG PO TABS: 10.0000 mg | ORAL_TABLET | Freq: Four times a day (QID) | ORAL | 0 refills | Status: AC | PRN

## 2022-07-29 MED ORDER — ONDANSETRON 8 MG PO TBDP: 8.0000 mg | ORAL_TABLET | Freq: Three times a day (TID) | ORAL | 0 refills | Status: AC | PRN

## 2022-07-29 MED ORDER — TAMSULOSIN HCL 0.4 MG PO CAPS: 0.4000 mg | ORAL_CAPSULE | Freq: Every day | ORAL | 0 refills | Status: AC

## 2022-07-29 MED ORDER — HYDROCODONE-ACETAMINOPHEN 5-325 MG PO TABS
1.0000 | ORAL_TABLET | Freq: Once | ORAL | Status: AC
  Administered 2022-07-29: 1 via ORAL
  Filled 2022-07-29: qty 1

## 2022-07-29 MED ORDER — HYDROCODONE-ACETAMINOPHEN 5-325 MG PO TABS: 1.0000 | ORAL_TABLET | ORAL | 0 refills | Status: AC | PRN

## 2022-07-29 MED ORDER — ONDANSETRON HCL 4 MG/2ML IJ SOLN
4.0000 mg | INTRAMUSCULAR | Status: AC
  Administered 2022-07-29: 4 mg via INTRAVENOUS
  Filled 2022-07-29: qty 2

## 2022-07-29 MED ORDER — TAMSULOSIN HCL 0.4 MG PO CAPS
0.8000 mg | ORAL_CAPSULE | Freq: Once | ORAL | Status: AC
  Administered 2022-07-29: 0.8 mg via ORAL
  Filled 2022-07-29: qty 2

## 2022-07-29 MED ORDER — SODIUM CHLORIDE 0.9 % IV BOLUS
500.0000 mL | Freq: Once | INTRAVENOUS | Status: AC
  Administered 2022-07-29: 500 mL via INTRAVENOUS

## 2022-07-29 MED ORDER — KETOROLAC TROMETHAMINE 30 MG/ML IJ SOLN
15.0000 mg | Freq: Once | INTRAMUSCULAR | Status: AC
  Administered 2022-07-29: 15 mg via INTRAVENOUS
  Filled 2022-07-29: qty 1

## 2022-07-29 NOTE — ED Provider Notes (Signed)
[facility] Provider Note    Event Date/Time   First MD Initiated Contact with Patient 07/29/22 1442     (approximate)   History   Flank Pain   HPI  Megan [name] is a 62 y.o. female   with a history of overactive bladder  Patient reports that about 6 weeks ago she had pain in her back that lasted a few days and she thought she might be passing a "kidney stone"  It seemed to get better and then today it came back but now on the lower position more along her right lower flank, and has a burning stinging component across her abdomen.  No pain or burning with urination no fevers or chills no chest pain.  It started hurting about 9 AM today.  It is fairly severe, sharp but she is able to tolerate it pretty well.  No nausea vomiting or diarrhea.      Physical Exam   Triage Vital Signs: ED Triage Vitals  Enc Vitals Group     BP 07/29/22 1148 (!) 166/98     Pulse Rate 07/29/22 1148 71     Resp 07/29/22 1148 18     Temp 07/29/22 1148 97.9 F (36.6 C)     Temp Source 07/29/22 1148 Oral     SpO2 07/29/22 1148 99 %     Weight 07/29/22 1149 243 lb (110.2 kg)     Height 07/29/22 1149 5\' 8"  (1.727 m)     Head Circumference --      Peak Flow --      Pain Score 07/29/22 1449 4     Pain Loc --      Pain Edu? --      Excl. in GC? --     Most recent vital signs: Vitals:   07/29/22 1445 07/29/22 1600  BP: (!) 142/78 (!) 150/81  Pulse: 67 (!) 55  Resp: 17 16  Temp:  98.1 F (36.7 C)  SpO2: 99% 100%     General: Awake, no distress.  CV:  Good peripheral perfusion.  Resp:  Normal effort.  Abd:  No distention.  Moderate right-sided costovertebral angle tenderness.  None on the left.  Abdomen soft nontender nondistended throughout.  Patient reports the pain is present and mild in her lower abdomen lright flank but it is not well elicited by exam   Other:  Warm well-perfused lower extremities bilateral.  Mucous membranes moist.  She is not actively  vomiting or having emesis reports her pain is a about a 5 or 6 out of 10, it was about a 9 out of 10 6 weeks ago when she thought she was passing a kidney stone   ED Results / Procedures / Treatments   Labs (all labs ordered are listed, but only abnormal results are displayed) Labs Reviewed  URINALYSIS, ROUTINE W REFLEX MICROSCOPIC - Abnormal; Notable for the following components:      Result Value   Color, Urine STRAW (*)    APPearance CLEAR (*)    Hgb urine dipstick LARGE (*)    Leukocytes,Ua TRACE (*)    All other components within normal limits  COMPREHENSIVE METABOLIC PANEL - Abnormal; Notable for the following components:   Glucose, Bld 108 (*)    BUN 23 (*)    All other components within normal limits  URINE CULTURE  CBC  LIPASE, BLOOD  POC URINE PREG, ED     EKG     RADIOLOGY  CT imaging personally interpreted by me is concerning for a kidney stone with hydronephrosis on the right side  CT Renal Stone Study  Result Date: 07/29/2022 CLINICAL DATA:  Right flank pain beginning around 9 a.m. today. EXAM: CT ABDOMEN AND PELVIS WITHOUT CONTRAST TECHNIQUE: Multidetector CT imaging of the abdomen and pelvis was performed following the standard protocol without IV contrast. RADIATION DOSE REDUCTION: This exam was performed according to the departmental dose-optimization program which includes automated exposure control, adjustment of the mA and/or kV according to patient size and/or use of iterative reconstruction technique. COMPARISON:  None Available. FINDINGS: Lower chest: No acute abnormality. Hepatobiliary: Liver normal in size and overall attenuation. 8 mm hypoattenuating lesion, segment 4A, consistent with a cyst. No other liver masses or lesions. Normal gallbladder. No bile duct dilation. Pancreas: Unremarkable. No pancreatic ductal dilatation or surrounding inflammatory changes. Spleen: Normal in size without focal abnormality. Adrenals/Urinary Tract: Normal adrenal  glands. Marked right hydronephrosis with hydroureter. This is due to a 9 mm stone at the right ureterovesicular junction. No left hydronephrosis. No renal masses. No intrarenal stones. Normal left ureter. Bladder mostly decompressed, otherwise unremarkable. Stomach/Bowel: Normal stomach. Small bowel and colon are normal in caliber. No wall thickening. No inflammation. Multiple colonic diverticula, mostly on the left. No diverticulitis. Vascular/Lymphatic: No significant vascular findings are present. No enlarged abdominal or pelvic lymph nodes. Reproductive: Uterus and bilateral adnexa are unremarkable. Other: No abdominal wall hernia or abnormality. No abdominopelvic ascites. Musculoskeletal: No fracture or acute finding.  No bone lesion. IMPRESSION: 1. 9 mm stone at the right ureterovesicular junction causes marked right hydronephrosis and hydroureter. 2. No other acute abnormality within the abdomen or pelvis. No intrarenal stones. Electronically Signed   By: Amie Portland M.D.   On: 07/29/2022 12:40      PROCEDURES:  Critical Care performed: No  Procedures   MEDICATIONS ORDERED IN ED: Medications  HYDROcodone-acetaminophen (NORCO/VICODIN) 5-325 MG per tablet 1 tablet (1 tablet Oral Given 07/29/22 1517)  ondansetron (ZOFRAN) injection 4 mg (4 mg Intravenous Given 07/29/22 1516)  sodium chloride 0.9 % bolus 500 mL (0 mLs Intravenous Stopped 07/29/22 1611)     IMPRESSION / MDM / ASSESSMENT AND PLAN / ED COURSE  I reviewed the triage vital signs and the nursing notes.                              Differential diagnosis includes but is not limited to, abdominal perforation, aortic dissection, cholecystitis, appendicitis, diverticulitis, colitis, esophagitis/gastritis, kidney stone, pyelonephritis, urinary tract infection, aortic aneurysm. All are considered in decision and treatment plan. Based upon the patient's presentation and risk factors, in light of her symptoms several weeks ago now with  similar not as severe but located a little lower on the right side and the sharp nature and passage of kidney stone seems quite probable.  Patient CT imaging demonstrates right-sided hydronephrosis with a right-sided kidney stone rather large in size but near the bladder.  Her pain is relatively well-controlled and she does not wish for any thing more than 1 tablet of hydrocodone reporting that pain is about a 5 out of 10.  She does not appear dehydrated she is not having active nausea or vomiting.  Ongoing care assigned to Dr. Elige Radon.  Will need follow-up on labs including CBC, metabolic panel urinalysis.  I anticipate that if her pain is well-controlled and her renal function and urinalysis are reassuring she may be able to  be discharged with close follow-up but do recommend discussion with urology given the size of her stone at about 9 mm.   Patient's presentation is most consistent with acute complicated illness / injury requiring diagnostic workup.          FINAL CLINICAL IMPRESSION(S) / ED DIAGNOSES   Final diagnoses:  Nephrolithiasis  Kidney stone on right side     Rx / DC Orders   ED Discharge Orders     None        Note:  This document was prepared using Dragon voice recognition software and may include unintentional dictation errors.   Sharyn Creamer, MD 07/29/22 [phone]

## 2022-07-29 NOTE — ED Triage Notes (Signed)
Pt states she started hurting in her right flank around 9am today. Pt states she has never been diagnosed with kidney stones but had a similar episode a few months ago.

## 2022-07-30 LAB — URINE CULTURE: Culture: NO GROWTH

## 2022-08-01 ENCOUNTER — Telehealth: Payer: Self-pay

## 2022-08-01 NOTE — Transitions of Care (Post Inpatient/ED Visit) (Signed)
08/01/2022  Name: NIYANNA DELVECCHIO MRN: [id number] DOB: Aug 26, 1960  Today's TOC FU Call Status: Today's TOC FU Call Status:: Successful TOC FU Call Competed TOC FU Call Complete Date: 08/01/22  Red on EMMI-ED Discharge Alert Date & Reason:07/31/22 "Scheduled follow-up appt? No"   Transition Care Management Follow-up Telephone Call Date of Discharge: 07/29/22 Discharge Facility: [facility]) Type of Discharge: Emergency Department Reason for ED Visit: Other: ("nephrolithiasis") How have you been since you were released from the hospital?: Better (Pt states she is "getting better each day- no irritation and back pain for past few days." She has not had to take any pain meds. Pt reports a little "pressure/sensation at bladder-thinks she may have "passed kidney stone.") Any questions or concerns?: No  Items Reviewed: Did you receive and understand the discharge instructions provided?: Yes Any new allergies since your discharge?: No Dietary orders reviewed?: NA Do you have support at home?: Yes People in Home: alone  Medications Reviewed Today: Medications Reviewed Today     Reviewed by Charlyn Minerva, RN (Registered Nurse) on 08/01/22 at 1256  Med List Status: <None>   Medication Order Taking? Sig Documenting Provider Last Dose Status Informant  fluticasone (FLONASE) 50 MCG/ACT nasal spray [id number] Yes 1 spray per nostril twice a day if needed for stuffy nose. Fletcher Anon, MD Taking Active   HYDROcodone-acetaminophen [facility]) 5-325 MG tablet [id number] Yes Take 1 tablet by mouth every 4 (four) hours as needed for moderate pain. Merwyn Katos, MD Taking Active   ketorolac (TORADOL) 10 MG tablet [id number] Yes Take 1 tablet (10 mg total) by mouth every 6 (six) hours as needed. Merwyn Katos, MD Taking Active   levothyroxine (SYNTHROID) 125 MCG tablet [id number] Yes TAKE ONE TAB BY MOUTH ONCE DAILY. TAKE ON AN EMPTY STOMACH WITH A GLASS OF  WATER ATLEAST 30-60 MINUTES BEFORE BREAKFAST Early, Sung Amabile, NP Taking Active   ondansetron (ZOFRAN-ODT) 8 MG disintegrating tablet [id number] Yes Take 1 tablet (8 mg total) by mouth every 8 (eight) hours as needed for nausea or vomiting. Merwyn Katos, MD Taking Active   tamsulosin [facility]) 0.4 MG CAPS capsule [id number] Yes Take 1 capsule (0.4 mg total) by mouth daily for 7 days. Merwyn Katos, MD Taking Active   tirzepatide [facility]) 2.5 MG/0.5ML Pen [id number] Yes Inject 2.5 mg into the skin once a week. Tollie Eth, NP Taking Active   valACYclovir (VALTREX) 1000 MG tablet [id number] Yes TAKE 2 TABLETS BY MOUTH EVERY 12 HOURS FOR 1 DAY Early, Sung Amabile, NP Taking Active   Vitamin D, Ergocalciferol, (DRISDOL) 1.25 MG (50000 UNIT) CAPS capsule [id number] Yes Take 1 capsule (50,000 Units total) by mouth every 7 (seven) days. Tollie Eth, NP Taking Active             Home Care and Equipment/Supplies: Were Home Health Services Ordered?: NA Any new equipment or medical supplies ordered?: NA  Functional Questionnaire: Do you need assistance with bathing/showering or dressing?: No Do you need assistance with meal preparation?: No Do you need assistance with eating?: No Do you have difficulty maintaining continence: No Do you need assistance with getting out of bed/getting out of a chair/moving?: No Do you have difficulty managing or taking your medications?: No  Follow up appointments reviewed: PCP Follow-up appointment confirmed?: NA Specialist Hospital Follow-up appointment confirmed?: No Reason Specialist Follow-Up Not Confirmed: Patient has Specialist Provider Number and will Call for Appointment (Pt voices she forgot to call urology  office to make an appt but will do so today. States she has seen a urologist in the past and wants to see them instead of the one listed on d/c paperwork.) Do you need transportation to your follow-up appointment?: No Do you understand care options if your  condition(s) worsen?: Yes-patient verbalized understanding  SDOH Interventions Today    Flowsheet Row Most Recent Value  SDOH Interventions   Food Insecurity Interventions Intervention Not Indicated  Transportation Interventions Intervention Not Indicated      TOC Interventions Today    Flowsheet Row Most Recent Value  TOC Interventions   TOC Interventions Discussed/Reviewed TOC Interventions Discussed, S/S of infection      Interventions Today    Flowsheet Row Most Recent Value  General Interventions   General Interventions Discussed/Reviewed General Interventions Discussed, Doctor Visits  Doctor Visits Discussed/Reviewed Doctor Visits Discussed, Specialist, PCP  PCP/Specialist Visits Compliance with follow-up visit  Education Interventions   Education Provided Provided Education  Provided Verbal Education On When to see the doctor, Medication, Other  [sx mgmt]  Nutrition Interventions   Nutrition Discussed/Reviewed Nutrition Discussed  Pharmacy Interventions   Pharmacy Dicussed/Reviewed Pharmacy Topics Discussed, Medications and their functions       Alessandra Grout [facility] Care Management Care Management Community Coordinator Direct Phone: [phone] Toll Free: [phone] Fax: [phone]

## 2022-09-03 ENCOUNTER — Telehealth: Payer: Self-pay | Admitting: Nurse Practitioner

## 2022-09-03 NOTE — Telephone Encounter (Signed)
PA Case ID #: [id number] Rx #: [id number] Need Help? Call us at [phone] Status sent iconSent to Plan today Drug Zepbound 2.5MG /0.5ML pen-injectors ePA cloud logo Form [employer] Electronic Prior Authorization Form

## 2022-09-05 ENCOUNTER — Other Ambulatory Visit: Payer: Self-pay | Admitting: Nurse Practitioner

## 2022-09-05 DIAGNOSIS — E559 Vitamin D deficiency, unspecified: Secondary | ICD-10-CM

## 2022-09-11 NOTE — Telephone Encounter (Signed)
P.A. [employer] completed also but states member not found, sent mychart message

## 2022-09-11 NOTE — Telephone Encounter (Signed)
Denied plan exclusion,  will try [employer]

## 2022-12-20 IMAGING — MG MM DIGITAL SCREENING BILAT W/ TOMO AND CAD
6 of 10 series · 6 of 30 positions shown · non-contrast
Comparison: Previous exam(s).

CLINICAL DATA: Screening.

EXAM:
DIGITAL SCREENING BILATERAL MAMMOGRAM WITH TOMOSYNTHESIS AND CAD
TECHNIQUE: Bilateral screening digital craniocaudal and mediolateral oblique
mammograms were obtained. Bilateral screening digital breast
tomosynthesis was performed. The images were evaluated with
computer-aided detection.

[L MLO synth-2D]
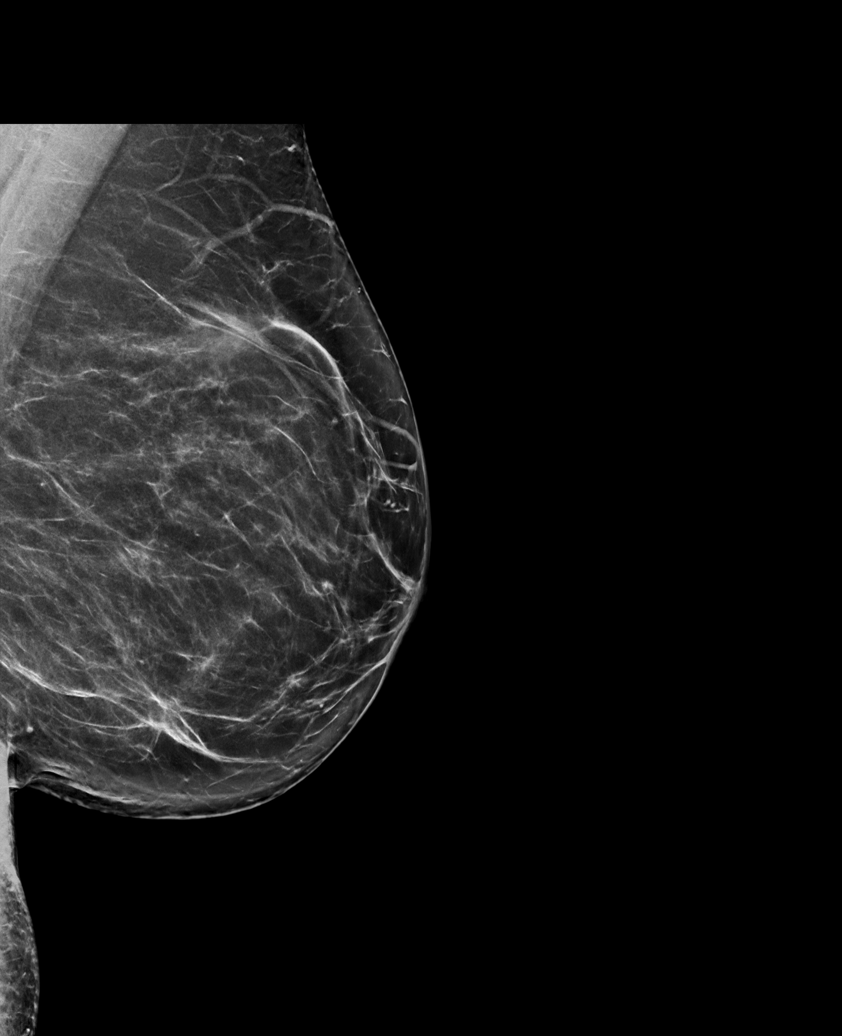

[L CC synth-2D]
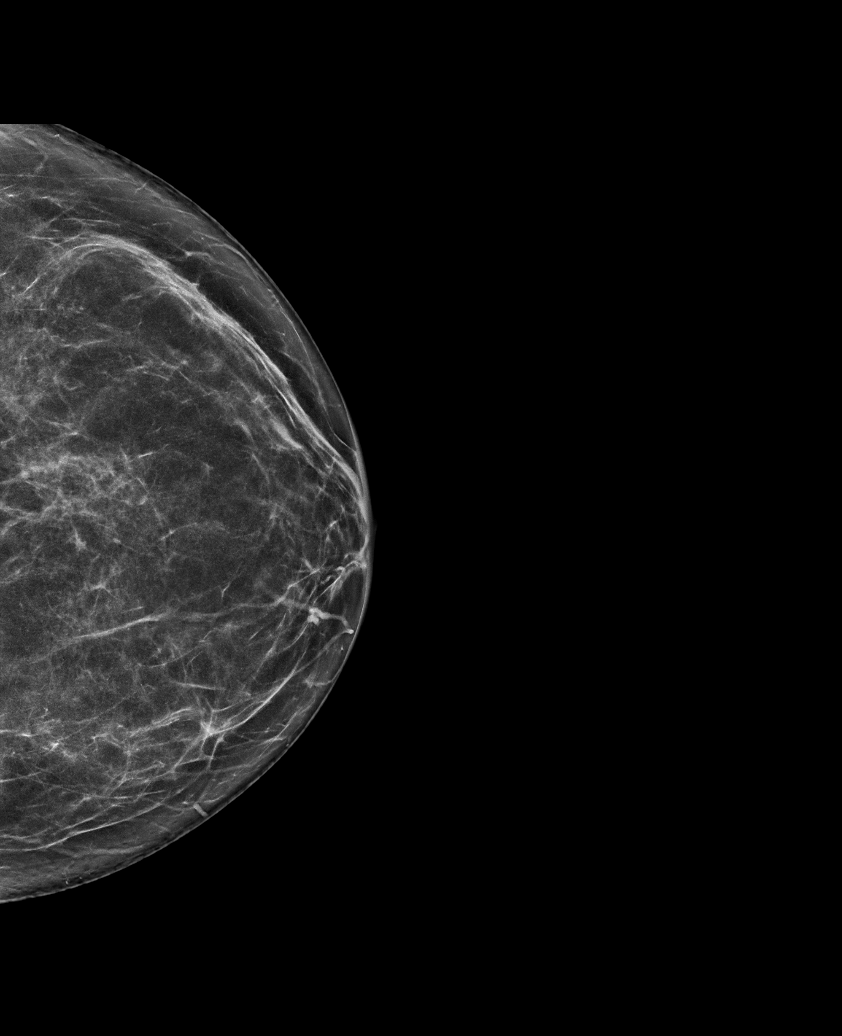

[R MLO synth-2D (1 of 2)]
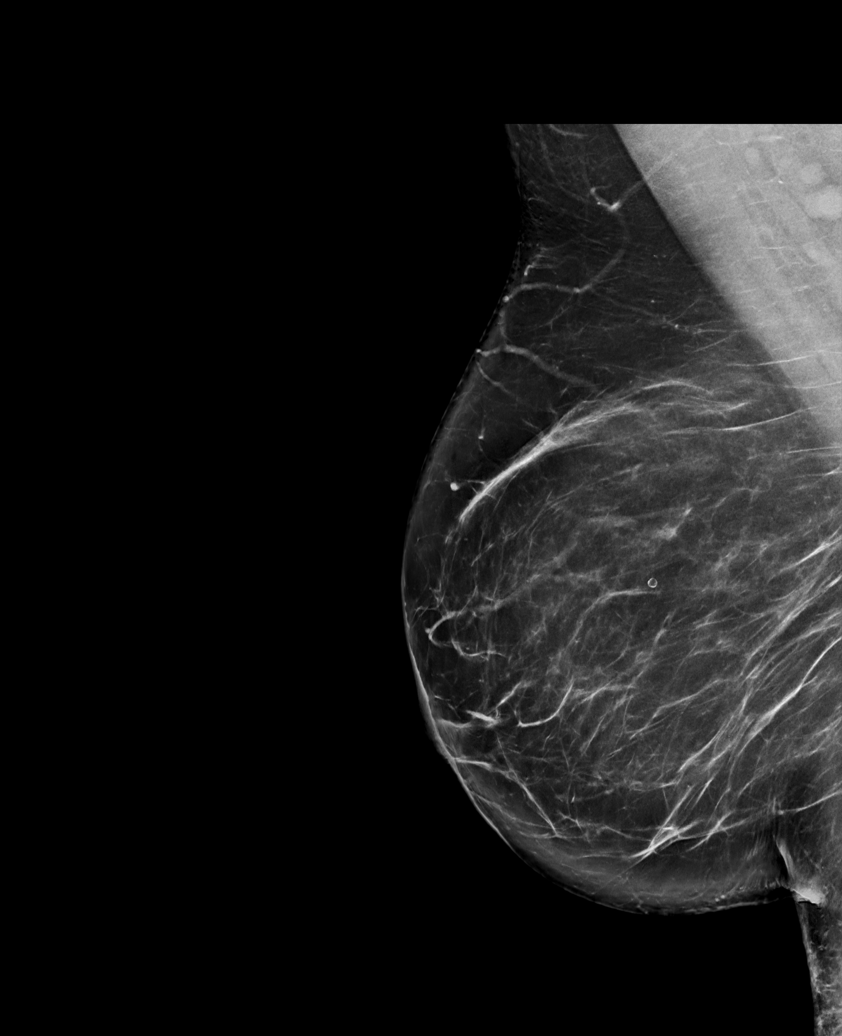

[R MLO synth-2D (2 of 2)]
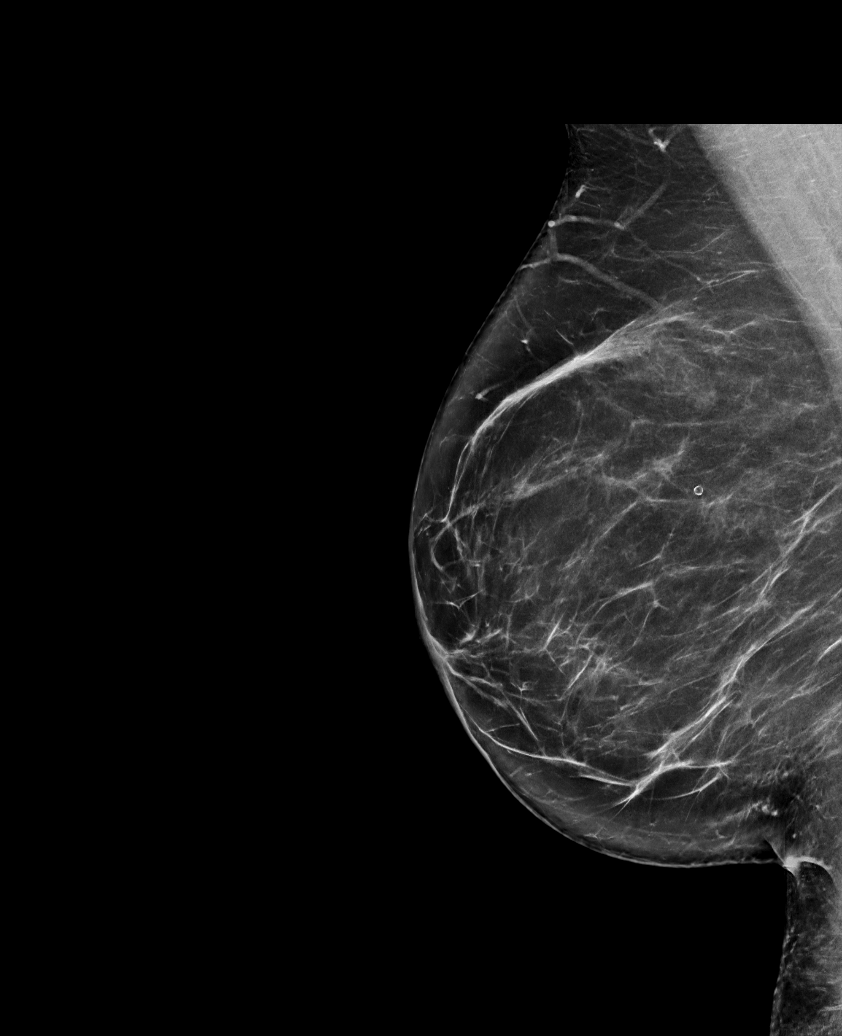

[R CC synth-2D]
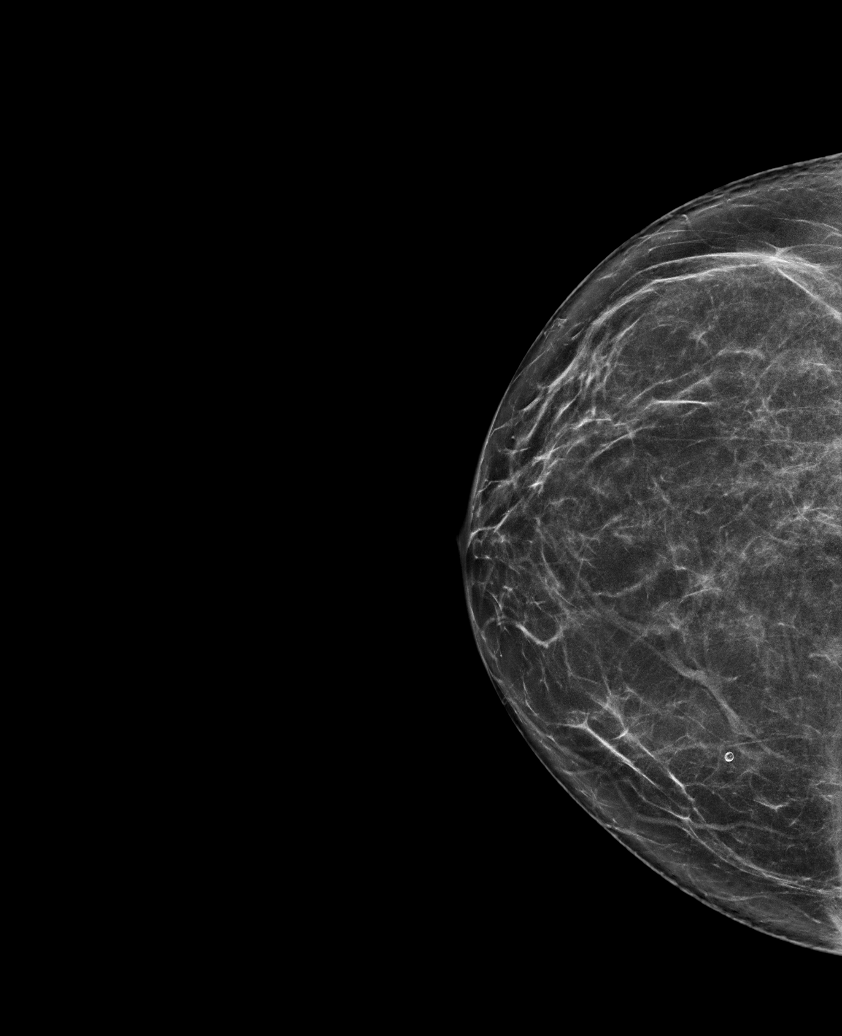

[R MLO tomo · tomo slice 46/91.0]
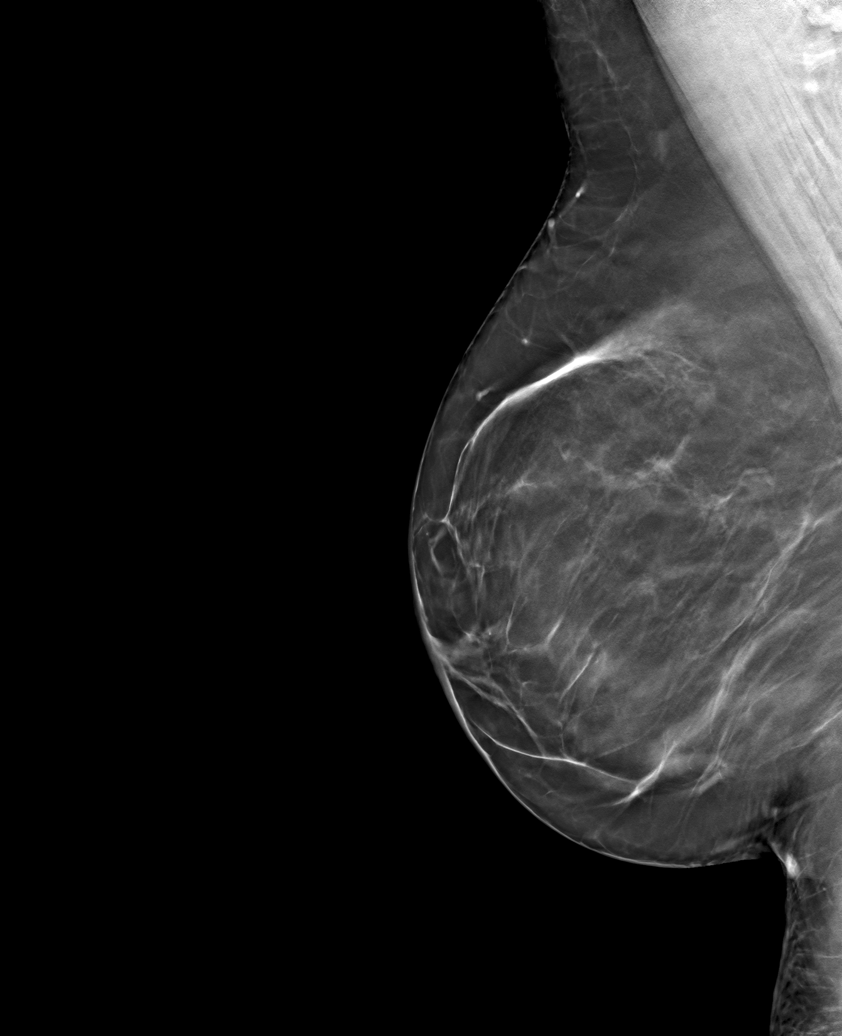

[6 of 30 positions shown; findings below may reference images not displayed]

ACR Breast Density Category b: There are scattered areas of
fibroglandular density.
FINDINGS: There are no findings suspicious for malignancy. The images were
evaluated with computer-aided detection.
IMPRESSION: No mammographic evidence of malignancy. A result letter of this
screening mammogram will be mailed directly to the patient.

RECOMMENDATION:
Screening mammogram in one year. (Code:WJ-I-BG6)

BI-RADS CATEGORY  1: Negative.

## 2023-01-01 ENCOUNTER — Other Ambulatory Visit: Payer: Self-pay | Admitting: Nurse Practitioner

## 2023-01-01 DIAGNOSIS — E039 Hypothyroidism, unspecified: Secondary | ICD-10-CM

## 2023-02-28 ENCOUNTER — Other Ambulatory Visit: Payer: Self-pay | Admitting: Nurse Practitioner

## 2023-02-28 DIAGNOSIS — E559 Vitamin D deficiency, unspecified: Secondary | ICD-10-CM

## 2023-03-27 ENCOUNTER — Encounter: Payer: BC Managed Care – PPO | Admitting: Nurse Practitioner

## 2023-04-10 DIAGNOSIS — N3281 Overactive bladder: Secondary | ICD-10-CM | POA: Diagnosis not present

## 2023-04-10 DIAGNOSIS — Z0000 Encounter for general adult medical examination without abnormal findings: Secondary | ICD-10-CM | POA: Diagnosis not present

## 2023-04-10 DIAGNOSIS — Z124 Encounter for screening for malignant neoplasm of cervix: Secondary | ICD-10-CM | POA: Diagnosis not present

## 2023-04-10 DIAGNOSIS — R7303 Prediabetes: Secondary | ICD-10-CM | POA: Diagnosis not present

## 2023-04-10 DIAGNOSIS — J302 Other seasonal allergic rhinitis: Secondary | ICD-10-CM | POA: Diagnosis not present

## 2023-04-10 DIAGNOSIS — E782 Mixed hyperlipidemia: Secondary | ICD-10-CM | POA: Diagnosis not present

## 2023-04-10 DIAGNOSIS — E559 Vitamin D deficiency, unspecified: Secondary | ICD-10-CM | POA: Diagnosis not present

## 2023-04-10 DIAGNOSIS — E039 Hypothyroidism, unspecified: Secondary | ICD-10-CM | POA: Diagnosis not present

## 2023-05-03 ENCOUNTER — Encounter: Admitting: Nurse Practitioner

## 2023-07-31 ENCOUNTER — Other Ambulatory Visit: Payer: Self-pay | Admitting: Nurse Practitioner

## 2023-07-31 DIAGNOSIS — E559 Vitamin D deficiency, unspecified: Secondary | ICD-10-CM

## 2024-01-02 DIAGNOSIS — L821 Other seborrheic keratosis: Secondary | ICD-10-CM | POA: Diagnosis not present

## 2024-01-02 DIAGNOSIS — L814 Other melanin hyperpigmentation: Secondary | ICD-10-CM | POA: Diagnosis not present

## 2024-01-02 DIAGNOSIS — L578 Other skin changes due to chronic exposure to nonionizing radiation: Secondary | ICD-10-CM | POA: Diagnosis not present

## 2024-01-02 DIAGNOSIS — D1801 Hemangioma of skin and subcutaneous tissue: Secondary | ICD-10-CM | POA: Diagnosis not present

## 2024-01-02 DIAGNOSIS — L570 Actinic keratosis: Secondary | ICD-10-CM | POA: Diagnosis not present
